# Patient Record
Sex: Female | Born: 1965
Health system: Southern US, Community
[De-identification: ages and names within clinical notes are randomized; demographics above are authoritative.]

---

## 1997-08-26 ENCOUNTER — Inpatient Hospital Stay (HOSPITAL_COMMUNITY): Admission: AD | Admit: 1997-08-26 | Discharge: 1997-08-29 | Payer: Self-pay | Admitting: Gynecology

## 1997-08-26 ENCOUNTER — Ambulatory Visit (HOSPITAL_COMMUNITY): Admission: RE | Admit: 1997-08-26 | Discharge: 1997-08-26 | Payer: Self-pay | Admitting: Gynecology

## 1997-08-26 ENCOUNTER — Other Ambulatory Visit: Admission: RE | Admit: 1997-08-26 | Discharge: 1997-08-26 | Payer: Self-pay | Admitting: Gynecology

## 1998-12-04 ENCOUNTER — Other Ambulatory Visit: Admission: RE | Admit: 1998-12-04 | Discharge: 1998-12-04 | Payer: Self-pay | Admitting: Gynecology

## 1999-11-12 ENCOUNTER — Emergency Department (HOSPITAL_COMMUNITY): Admission: EM | Admit: 1999-11-12 | Discharge: 1999-11-12 | Payer: Self-pay | Admitting: Emergency Medicine

## 1999-12-09 ENCOUNTER — Other Ambulatory Visit: Admission: RE | Admit: 1999-12-09 | Discharge: 1999-12-09 | Payer: Self-pay | Admitting: Gynecology

## 2002-06-25 ENCOUNTER — Other Ambulatory Visit: Admission: RE | Admit: 2002-06-25 | Discharge: 2002-06-25 | Payer: Self-pay | Admitting: Gynecology

## 2003-06-26 ENCOUNTER — Other Ambulatory Visit: Admission: RE | Admit: 2003-06-26 | Discharge: 2003-06-26 | Payer: Self-pay | Admitting: Gynecology

## 2004-09-08 ENCOUNTER — Other Ambulatory Visit: Admission: RE | Admit: 2004-09-08 | Discharge: 2004-09-08 | Payer: Self-pay | Admitting: Gynecology

## 2004-09-28 ENCOUNTER — Ambulatory Visit (HOSPITAL_COMMUNITY): Admission: RE | Admit: 2004-09-28 | Discharge: 2004-09-28 | Payer: Self-pay | Admitting: Gynecology

## 2005-09-28 ENCOUNTER — Other Ambulatory Visit: Admission: RE | Admit: 2005-09-28 | Discharge: 2005-09-28 | Payer: Self-pay | Admitting: Gynecology

## 2009-01-20 ENCOUNTER — Ambulatory Visit (HOSPITAL_COMMUNITY): Admission: RE | Admit: 2009-01-20 | Discharge: 2009-01-20 | Payer: Self-pay | Admitting: Obstetrics and Gynecology

## 2009-01-30 ENCOUNTER — Encounter: Admission: RE | Admit: 2009-01-30 | Discharge: 2009-01-30 | Payer: Self-pay | Admitting: Obstetrics and Gynecology

## 2009-02-04 ENCOUNTER — Encounter: Admission: RE | Admit: 2009-02-04 | Discharge: 2009-02-04 | Payer: Self-pay | Admitting: Obstetrics and Gynecology

## 2009-02-10 ENCOUNTER — Encounter: Admission: RE | Admit: 2009-02-10 | Discharge: 2009-02-10 | Payer: Self-pay | Admitting: Obstetrics and Gynecology

## 2009-03-31 ENCOUNTER — Ambulatory Visit (HOSPITAL_COMMUNITY): Admission: RE | Admit: 2009-03-31 | Discharge: 2009-03-31 | Payer: Self-pay | Admitting: Diagnostic Radiology

## 2009-04-01 ENCOUNTER — Ambulatory Visit (HOSPITAL_COMMUNITY): Admission: RE | Admit: 2009-04-01 | Discharge: 2009-04-01 | Payer: Self-pay | Admitting: Diagnostic Radiology

## 2009-04-03 ENCOUNTER — Ambulatory Visit (HOSPITAL_COMMUNITY): Admission: RE | Admit: 2009-04-03 | Discharge: 2009-04-04 | Payer: Self-pay | Admitting: Diagnostic Radiology

## 2009-05-06 ENCOUNTER — Encounter: Admission: RE | Admit: 2009-05-06 | Discharge: 2009-05-06 | Payer: Self-pay | Admitting: Diagnostic Radiology

## 2009-07-16 ENCOUNTER — Encounter: Admission: RE | Admit: 2009-07-16 | Discharge: 2009-07-16 | Payer: Self-pay | Admitting: Obstetrics and Gynecology

## 2009-09-29 ENCOUNTER — Encounter: Admission: RE | Admit: 2009-09-29 | Discharge: 2009-09-29 | Payer: Self-pay | Admitting: Obstetrics and Gynecology

## 2009-10-01 ENCOUNTER — Encounter: Admission: RE | Admit: 2009-10-01 | Discharge: 2009-10-01 | Payer: Self-pay | Admitting: Diagnostic Radiology

## 2010-02-23 ENCOUNTER — Encounter: Admission: RE | Admit: 2010-02-23 | Discharge: 2010-02-23 | Payer: Self-pay | Admitting: Obstetrics and Gynecology

## 2010-07-06 LAB — CBC
Hemoglobin: 13.9 g/dL (ref 12.0–15.0)
MCHC: 34 g/dL (ref 30.0–36.0)
RBC: 4.57 MIL/uL (ref 3.87–5.11)

## 2010-07-06 LAB — PREGNANCY, URINE: Preg Test, Ur: NEGATIVE

## 2010-07-06 LAB — CREATININE, SERUM: Creatinine, Ser: 0.89 mg/dL (ref 0.4–1.2)

## 2011-03-11 ENCOUNTER — Other Ambulatory Visit: Payer: Self-pay | Admitting: Obstetrics and Gynecology

## 2011-03-11 DIAGNOSIS — Z1231 Encounter for screening mammogram for malignant neoplasm of breast: Secondary | ICD-10-CM

## 2011-04-15 ENCOUNTER — Ambulatory Visit
Admission: RE | Admit: 2011-04-15 | Discharge: 2011-04-15 | Disposition: A | Discharge status: Home / Self Care | Payer: BC Managed Care – PPO | Source: Ambulatory Visit | Attending: Obstetrics and Gynecology | Admitting: Obstetrics and Gynecology

## 2011-04-15 DIAGNOSIS — Z1231 Encounter for screening mammogram for malignant neoplasm of breast: Secondary | ICD-10-CM

## 2012-04-19 ENCOUNTER — Other Ambulatory Visit: Payer: Self-pay | Admitting: Obstetrics and Gynecology

## 2012-04-19 DIAGNOSIS — Z1231 Encounter for screening mammogram for malignant neoplasm of breast: Secondary | ICD-10-CM

## 2012-05-29 ENCOUNTER — Ambulatory Visit
Admission: RE | Admit: 2012-05-29 | Discharge: 2012-05-29 | Disposition: A | Discharge status: Home / Self Care | Payer: BC Managed Care – PPO | Source: Ambulatory Visit | Attending: Obstetrics and Gynecology | Admitting: Obstetrics and Gynecology

## 2012-05-29 DIAGNOSIS — Z1231 Encounter for screening mammogram for malignant neoplasm of breast: Secondary | ICD-10-CM

## 2013-08-20 ENCOUNTER — Other Ambulatory Visit: Payer: Self-pay

## 2013-08-20 DIAGNOSIS — Z1231 Encounter for screening mammogram for malignant neoplasm of breast: Secondary | ICD-10-CM

## 2013-09-07 ENCOUNTER — Encounter (INDEPENDENT_AMBULATORY_CARE_PROVIDER_SITE_OTHER): Payer: Self-pay

## 2013-09-07 ENCOUNTER — Ambulatory Visit
Admission: RE | Admit: 2013-09-07 | Discharge: 2013-09-07 | Disposition: A | Discharge status: Home / Self Care | Payer: BC Managed Care – PPO | Source: Ambulatory Visit

## 2013-09-07 DIAGNOSIS — Z1231 Encounter for screening mammogram for malignant neoplasm of breast: Secondary | ICD-10-CM

## 2014-06-27 ENCOUNTER — Emergency Department (INDEPENDENT_AMBULATORY_CARE_PROVIDER_SITE_OTHER)
Admission: EM | Admit: 2014-06-27 | Discharge: 2014-06-27 | Disposition: A | Discharge status: Home / Self Care | Payer: BLUE CROSS/BLUE SHIELD | Source: Home / Self Care | Attending: Family Medicine | Admitting: Family Medicine

## 2014-06-27 ENCOUNTER — Encounter (HOSPITAL_COMMUNITY): Payer: Self-pay | Admitting: Emergency Medicine

## 2014-06-27 DIAGNOSIS — R05 Cough: Secondary | ICD-10-CM

## 2014-06-27 DIAGNOSIS — R0982 Postnasal drip: Secondary | ICD-10-CM

## 2014-06-27 DIAGNOSIS — J040 Acute laryngitis: Secondary | ICD-10-CM

## 2014-06-27 DIAGNOSIS — R059 Cough, unspecified: Secondary | ICD-10-CM

## 2014-06-27 MED ORDER — PREDNISONE 10 MG PO TABS: 30.0000 mg | ORAL_TABLET | Freq: Every day | ORAL | Status: DC

## 2014-06-27 MED ORDER — FLUTICASONE PROPIONATE 50 MCG/ACT NA SUSP: 2.0000 | Freq: Every day | NASAL | Status: DC

## 2014-06-27 MED ORDER — OMEPRAZOLE 40 MG PO CPDR
40.0000 mg | DELAYED_RELEASE_CAPSULE | Freq: Every day | ORAL | Status: DC
Start: 2014-06-27 — End: 2020-09-23

## 2014-06-27 NOTE — ED Provider Notes (Signed)
Andrea MenghiniSonya Valencia is a 49 y.o. female who presents to Urgent Care today for cough. Patient has a bothersome cough especially bedtime. This is been present for 3-4 days. Yesterday she developed a hoarse voice. She denies any wheezing or shortness of breath. She does note a runny nose and itchy watery eyes. She denies any vomiting or diarrhea. She has a history of acid reflux. She denies any burning in her chest currently. She's tried some Mucinex which did not help very much. No chest pains or palpitations. No significant fevers or chills.   History reviewed. No pertinent past medical history. History reviewed. No pertinent past surgical history. History  Substance Use Topics  . Smoking status: Never Smoker   . Smokeless tobacco: Not on file  . Alcohol Use: No   ROS as above Medications: No current facility-administered medications for this encounter.   Current Outpatient Prescriptions  Medication Sig Dispense Refill  . fluticasone (FLONASE) 50 MCG/ACT nasal spray Place 2 sprays into both nostrils daily. 16 g 2  . omeprazole (PRILOSEC) 40 MG capsule Take 1 capsule (40 mg total) by mouth daily. 30 capsule 1  . predniSONE (DELTASONE) 10 MG tablet Take 3 tablets (30 mg total) by mouth daily. 15 tablet 0   No Known Allergies   Exam:  BP 141/96 mmHg  Pulse 62  Temp(Src) 98.8 F (37.1 C) (Oral)  Resp 14  SpO2 99% Gen: Well NAD HEENT: EOMI,  MMM posterior pharynx with cobblestoning. Normal tympanic membranes bilaterally. Clear nasal discharge. Lungs: Normal work of breathing. CTABL hoarse voice Heart: RRR no MRG Abd: NABS, Soft. Nondistended, Nontender Exts: Brisk capillary refill, warm and well perfused.   No results found for this or any previous visit (from the past 24 hour(s)). No results found.  Assessment and Plan: 49 y.o. female with postnasal drip with laryngitis. Postnasal drip likely due to seasonal allergies. Treat with Flonase nasal spray and prednisone. Patient may also have  a component of acid reflux. Will treat with omeprazole as well. Work note provided. Return as needed.  Discussed warning signs or symptoms. Please see discharge instructions. Patient expresses understanding.     Rodolph BongEvan S Saleah Rishel, MD 06/27/14 1051

## 2014-06-27 NOTE — Discharge Instructions (Signed)
Thank you for coming in today. Call or go to the emergency room if you get worse, have trouble breathing, have chest pains, or palpitations.    Cough, Adult  A cough is a reflex that helps clear your throat and airways. It can help heal the body or may be a reaction to an irritated airway. A cough may only last 2 or 3 weeks (acute) or may last more than 8 weeks (chronic).  CAUSES Acute cough:  Viral or bacterial infections. Chronic cough:  Infections.  Allergies.  Asthma.  Post-nasal drip.  Smoking.  Heartburn or acid reflux.  Some medicines.  Chronic lung problems (COPD).  Cancer. SYMPTOMS   Cough.  Fever.  Chest pain.  Increased breathing rate.  High-pitched whistling sound when breathing (wheezing).  Colored mucus that you cough up (sputum). TREATMENT   A bacterial cough may be treated with antibiotic medicine.  A viral cough must run its course and will not respond to antibiotics.  Your caregiver may recommend other treatments if you have a chronic cough. HOME CARE INSTRUCTIONS   Only take over-the-counter or prescription medicines for pain, discomfort, or fever as directed by your caregiver. Use cough suppressants only as directed by your caregiver.  Use a cold steam vaporizer or humidifier in your bedroom or home to help loosen secretions.  Sleep in a semi-upright position if your cough is worse at night.  Rest as needed.  Stop smoking if you smoke. SEEK IMMEDIATE MEDICAL CARE IF:   You have pus in your sputum.  Your cough starts to worsen.  You cannot control your cough with suppressants and are losing sleep.  You begin coughing up blood.  You have difficulty breathing.  You develop pain which is getting worse or is uncontrolled with medicine.  You have a fever. MAKE SURE YOU:   Understand these instructions.  Will watch your condition.  Will get help right away if you are not doing well or get worse. Document Released:  09/18/2010 Document Revised: 06/14/2011 Document Reviewed: 09/18/2010 [facility] Patient Information 2015 [place], [place]. This information is not intended to replace advice given to you by your health care provider. Make sure you discuss any questions you have with your health care provider.   Laryngitis At the top of your windpipe is your voice box. It is the source of your voice. Inside your voice box are 2 bands of muscles called vocal cords. When you breathe, your vocal cords are relaxed and open so that air can get into the lungs. When you decide to say something, these cords come together and vibrate. The sound from these vibrations goes into your throat and comes out through your mouth as sound. Laryngitis is an inflammation of the vocal cords that causes hoarseness, cough, loss of voice, sore throat, and dry throat. Laryngitis can be temporary (acute) or long-term (chronic). Most cases of acute laryngitis improve with time.Chronic laryngitis lasts for more than 3 weeks. CAUSES Laryngitis can often be related to excessive smoking, talking, or yelling, as well as inhalation of toxic fumes and allergies. Acute laryngitis is usually caused by a viral infection, vocal strain, measles or mumps, or bacterial infections. Chronic laryngitis is usually caused by vocal cord strain, vocal cord injury, postnasal drip, growths on the vocal cords, or acid reflux. SYMPTOMS   Cough.  Sore throat.  Dry throat. RISK FACTORS  Respiratory infections.  Exposure to irritating substances, such as cigarette smoke, excessive amounts of alcohol, stomach acids, and workplace chemicals.  Voice  trauma, such as vocal cord injury from shouting or speaking too loud. DIAGNOSIS  Your cargiver will perform a physical exam. During the physical exam, your caregiver will examine your throat. The most common sign of laryngitis is hoarseness. Laryngoscopy may be necessary to confirm the diagnosis of this condition. This  procedure allows your caregiver to look into the larynx. HOME CARE INSTRUCTIONS  Drink enough fluids to keep your urine clear or pale yellow.  Rest until you no longer have symptoms or as directed by your caregiver.  Breathe in moist air.  Take all medicine as directed by your caregiver.  Do not smoke.  Talk as little as possible (this includes whispering).  Write on paper instead of talking until your voice is back to normal.  Follow up with your caregiver if your condition has not improved after 10 days. SEEK MEDICAL CARE IF:   You have trouble breathing.  You cough up blood.  You have persistent fever.  You have increasing pain.  You have difficulty swallowing. MAKE SURE YOU:  Understand these instructions.  Will watch your condition.  Will get help right away if you are not doing well or get worse. Document Released: 03/22/2005 Document Revised: 06/14/2011 Document Reviewed: 05/28/2010 [facility] Patient Information 2015 [place], [place]. This information is not intended to replace advice given to you by your health care provider. Make sure you discuss any questions you have with your health care provider.

## 2014-06-27 NOTE — ED Notes (Signed)
Reports having cold symptoms on 3/18.  Reports cough stating on the 21 st only at night and is productive.  Work on 3/23 with no voice.  Denies fever and diarrhea.  C/o vomiting with coughing spells.  No relief with otc meds.

## 2014-09-19 ENCOUNTER — Other Ambulatory Visit: Payer: Self-pay

## 2014-09-19 DIAGNOSIS — Z1231 Encounter for screening mammogram for malignant neoplasm of breast: Secondary | ICD-10-CM

## 2014-09-23 ENCOUNTER — Ambulatory Visit
Admission: RE | Admit: 2014-09-23 | Discharge: 2014-09-23 | Disposition: A | Discharge status: Home / Self Care | Payer: BLUE CROSS/BLUE SHIELD | Source: Ambulatory Visit

## 2014-09-23 DIAGNOSIS — Z1231 Encounter for screening mammogram for malignant neoplasm of breast: Secondary | ICD-10-CM

## 2014-09-25 ENCOUNTER — Other Ambulatory Visit: Payer: Self-pay | Admitting: Obstetrics and Gynecology

## 2014-09-25 DIAGNOSIS — R928 Other abnormal and inconclusive findings on diagnostic imaging of breast: Secondary | ICD-10-CM

## 2014-09-30 ENCOUNTER — Ambulatory Visit
Admission: RE | Admit: 2014-09-30 | Discharge: 2014-09-30 | Disposition: A | Discharge status: Home / Self Care | Payer: BLUE CROSS/BLUE SHIELD | Source: Ambulatory Visit | Attending: Obstetrics and Gynecology | Admitting: Obstetrics and Gynecology

## 2014-09-30 DIAGNOSIS — R928 Other abnormal and inconclusive findings on diagnostic imaging of breast: Secondary | ICD-10-CM

## 2015-12-29 ENCOUNTER — Other Ambulatory Visit: Payer: Self-pay | Admitting: Obstetrics and Gynecology

## 2015-12-29 DIAGNOSIS — Z1231 Encounter for screening mammogram for malignant neoplasm of breast: Secondary | ICD-10-CM

## 2016-01-07 ENCOUNTER — Ambulatory Visit
Admission: RE | Admit: 2016-01-07 | Discharge: 2016-01-07 | Disposition: A | Discharge status: Home / Self Care | Payer: BLUE CROSS/BLUE SHIELD | Source: Ambulatory Visit | Attending: Obstetrics and Gynecology | Admitting: Obstetrics and Gynecology

## 2016-01-07 DIAGNOSIS — Z1231 Encounter for screening mammogram for malignant neoplasm of breast: Secondary | ICD-10-CM

## 2017-02-22 DIAGNOSIS — Z713 Dietary counseling and surveillance: Secondary | ICD-10-CM | POA: Diagnosis not present

## 2017-02-28 ENCOUNTER — Other Ambulatory Visit: Payer: Self-pay | Admitting: Obstetrics and Gynecology

## 2017-02-28 DIAGNOSIS — Z1231 Encounter for screening mammogram for malignant neoplasm of breast: Secondary | ICD-10-CM

## 2017-03-25 ENCOUNTER — Ambulatory Visit: Payer: BLUE CROSS/BLUE SHIELD

## 2017-04-20 ENCOUNTER — Ambulatory Visit
Admission: RE | Admit: 2017-04-20 | Discharge: 2017-04-20 | Disposition: A | Discharge status: Home / Self Care | Payer: BLUE CROSS/BLUE SHIELD | Source: Ambulatory Visit | Attending: Obstetrics and Gynecology | Admitting: Obstetrics and Gynecology

## 2017-04-20 DIAGNOSIS — Z1231 Encounter for screening mammogram for malignant neoplasm of breast: Secondary | ICD-10-CM | POA: Diagnosis not present

## 2017-04-20 DIAGNOSIS — Z0000 Encounter for general adult medical examination without abnormal findings: Secondary | ICD-10-CM | POA: Diagnosis not present

## 2017-04-21 DIAGNOSIS — Z1151 Encounter for screening for human papillomavirus (HPV): Secondary | ICD-10-CM | POA: Diagnosis not present

## 2017-04-21 DIAGNOSIS — Z6829 Body mass index (BMI) 29.0-29.9, adult: Secondary | ICD-10-CM | POA: Diagnosis not present

## 2017-04-21 DIAGNOSIS — Z01419 Encounter for gynecological examination (general) (routine) without abnormal findings: Secondary | ICD-10-CM | POA: Diagnosis not present

## 2017-04-26 DIAGNOSIS — Z713 Dietary counseling and surveillance: Secondary | ICD-10-CM | POA: Diagnosis not present

## 2017-06-23 DIAGNOSIS — Z1211 Encounter for screening for malignant neoplasm of colon: Secondary | ICD-10-CM | POA: Diagnosis not present

## 2017-06-23 DIAGNOSIS — Z800 Family history of malignant neoplasm of digestive organs: Secondary | ICD-10-CM | POA: Diagnosis not present

## 2017-06-28 DIAGNOSIS — Z713 Dietary counseling and surveillance: Secondary | ICD-10-CM | POA: Diagnosis not present

## 2017-07-15 DIAGNOSIS — Z800 Family history of malignant neoplasm of digestive organs: Secondary | ICD-10-CM | POA: Diagnosis not present

## 2017-07-15 DIAGNOSIS — Z1211 Encounter for screening for malignant neoplasm of colon: Secondary | ICD-10-CM | POA: Diagnosis not present

## 2017-09-27 DIAGNOSIS — Z713 Dietary counseling and surveillance: Secondary | ICD-10-CM | POA: Diagnosis not present

## 2018-03-07 DIAGNOSIS — Z713 Dietary counseling and surveillance: Secondary | ICD-10-CM | POA: Diagnosis not present

## 2018-05-03 ENCOUNTER — Other Ambulatory Visit: Payer: Self-pay | Admitting: Obstetrics and Gynecology

## 2018-05-03 DIAGNOSIS — Z1231 Encounter for screening mammogram for malignant neoplasm of breast: Secondary | ICD-10-CM

## 2018-05-09 DIAGNOSIS — Z713 Dietary counseling and surveillance: Secondary | ICD-10-CM | POA: Diagnosis not present

## 2018-05-24 DIAGNOSIS — Z0000 Encounter for general adult medical examination without abnormal findings: Secondary | ICD-10-CM | POA: Diagnosis not present

## 2018-05-31 ENCOUNTER — Ambulatory Visit
Admission: RE | Admit: 2018-05-31 | Discharge: 2018-05-31 | Disposition: A | Discharge status: Home / Self Care | Payer: BLUE CROSS/BLUE SHIELD | Source: Ambulatory Visit | Attending: Obstetrics and Gynecology | Admitting: Obstetrics and Gynecology

## 2018-05-31 DIAGNOSIS — Z1231 Encounter for screening mammogram for malignant neoplasm of breast: Secondary | ICD-10-CM

## 2018-06-01 ENCOUNTER — Other Ambulatory Visit: Payer: Self-pay | Admitting: Obstetrics and Gynecology

## 2018-06-01 DIAGNOSIS — R928 Other abnormal and inconclusive findings on diagnostic imaging of breast: Secondary | ICD-10-CM

## 2018-06-07 ENCOUNTER — Ambulatory Visit
Admission: RE | Admit: 2018-06-07 | Discharge: 2018-06-07 | Disposition: A | Discharge status: Home / Self Care | Payer: BLUE CROSS/BLUE SHIELD | Source: Ambulatory Visit | Attending: Obstetrics and Gynecology | Admitting: Obstetrics and Gynecology

## 2018-06-07 DIAGNOSIS — R928 Other abnormal and inconclusive findings on diagnostic imaging of breast: Secondary | ICD-10-CM

## 2018-06-07 DIAGNOSIS — N6012 Diffuse cystic mastopathy of left breast: Secondary | ICD-10-CM | POA: Diagnosis not present

## 2018-06-07 DIAGNOSIS — R922 Inconclusive mammogram: Secondary | ICD-10-CM | POA: Diagnosis not present

## 2018-12-19 DIAGNOSIS — Z6829 Body mass index (BMI) 29.0-29.9, adult: Secondary | ICD-10-CM | POA: Diagnosis not present

## 2018-12-19 DIAGNOSIS — Z23 Encounter for immunization: Secondary | ICD-10-CM | POA: Diagnosis not present

## 2018-12-19 DIAGNOSIS — Z1151 Encounter for screening for human papillomavirus (HPV): Secondary | ICD-10-CM | POA: Diagnosis not present

## 2018-12-19 DIAGNOSIS — Z01419 Encounter for gynecological examination (general) (routine) without abnormal findings: Secondary | ICD-10-CM | POA: Diagnosis not present

## 2019-04-19 DIAGNOSIS — Z713 Dietary counseling and surveillance: Secondary | ICD-10-CM | POA: Diagnosis not present

## 2019-07-03 DIAGNOSIS — Z713 Dietary counseling and surveillance: Secondary | ICD-10-CM | POA: Diagnosis not present

## 2019-07-24 ENCOUNTER — Other Ambulatory Visit: Payer: Self-pay | Admitting: Obstetrics and Gynecology

## 2019-07-24 DIAGNOSIS — Z1231 Encounter for screening mammogram for malignant neoplasm of breast: Secondary | ICD-10-CM

## 2019-08-27 ENCOUNTER — Ambulatory Visit
Admission: RE | Admit: 2019-08-27 | Discharge: 2019-08-27 | Disposition: A | Discharge status: Home / Self Care | Payer: BC Managed Care – PPO | Source: Ambulatory Visit | Attending: Obstetrics and Gynecology | Admitting: Obstetrics and Gynecology

## 2019-08-27 ENCOUNTER — Other Ambulatory Visit: Payer: Self-pay

## 2019-08-27 DIAGNOSIS — Z1231 Encounter for screening mammogram for malignant neoplasm of breast: Secondary | ICD-10-CM | POA: Diagnosis not present

## 2019-09-20 DIAGNOSIS — Z713 Dietary counseling and surveillance: Secondary | ICD-10-CM | POA: Diagnosis not present

## 2019-11-21 DIAGNOSIS — Z713 Dietary counseling and surveillance: Secondary | ICD-10-CM | POA: Diagnosis not present

## 2020-01-28 DIAGNOSIS — Z713 Dietary counseling and surveillance: Secondary | ICD-10-CM | POA: Diagnosis not present

## 2020-03-17 DIAGNOSIS — Z780 Asymptomatic menopausal state: Secondary | ICD-10-CM | POA: Diagnosis not present

## 2020-03-17 DIAGNOSIS — Z01419 Encounter for gynecological examination (general) (routine) without abnormal findings: Secondary | ICD-10-CM | POA: Diagnosis not present

## 2020-04-19 ENCOUNTER — Other Ambulatory Visit: Payer: Self-pay | Admitting: Obstetrics and Gynecology

## 2020-04-19 DIAGNOSIS — Z780 Asymptomatic menopausal state: Secondary | ICD-10-CM

## 2020-08-16 ENCOUNTER — Encounter (HOSPITAL_COMMUNITY): Payer: Self-pay

## 2020-08-16 ENCOUNTER — Ambulatory Visit (HOSPITAL_COMMUNITY)
Admission: EM | Admit: 2020-08-16 | Discharge: 2020-08-16 | Disposition: A | Discharge status: Home / Self Care | Payer: BC Managed Care – PPO | Attending: Medical Oncology | Admitting: Medical Oncology

## 2020-08-16 ENCOUNTER — Other Ambulatory Visit: Payer: Self-pay

## 2020-08-16 DIAGNOSIS — J069 Acute upper respiratory infection, unspecified: Secondary | ICD-10-CM | POA: Diagnosis not present

## 2020-08-16 DIAGNOSIS — U071 COVID-19: Secondary | ICD-10-CM | POA: Insufficient documentation

## 2020-08-16 MED ORDER — ALBUTEROL SULFATE HFA 108 (90 BASE) MCG/ACT IN AERS: 1.0000 | INHALATION_SPRAY | Freq: Four times a day (QID) | RESPIRATORY_TRACT | 0 refills | Status: DC | PRN

## 2020-08-16 MED ORDER — FLUTICASONE PROPIONATE 50 MCG/ACT NA SUSP: 2.0000 | Freq: Every day | NASAL | 0 refills | Status: DC

## 2020-08-16 MED ORDER — BENZONATATE 100 MG PO CAPS: 100.0000 mg | ORAL_CAPSULE | Freq: Three times a day (TID) | ORAL | 0 refills | Status: DC

## 2020-08-16 NOTE — ED Triage Notes (Signed)
Pt in with c/o productive cough x 4 days  Pt has been taking robitussin and mucinex with no relief

## 2020-08-16 NOTE — ED Provider Notes (Signed)
[facility]    CSN: [id number] Arrival date & time: 08/16/20  1400      History   Chief Complaint Chief Complaint  Patient presents with  . Cough  . chest congestion    HPI Michaelyn Wall is a 55 y.o. female.   HPI   Cough: Pt reports that for the past 4 days she has had a dry to occasionally productive cough. Associated low grade fevers, sore throat and clear nasal drainage. Sputum is clear. She has been taking Robitussin and mucinex with no relief. No chest pains, SOB, wheezing.   History reviewed. No pertinent past medical history.  There are no problems to display for this patient.   History reviewed. No pertinent surgical history.  OB History   No obstetric history on file.      Home Medications    Prior to Admission medications   Medication Sig Start Date End Date Taking? Authorizing Provider  fluticasone (FLONASE) 50 MCG/ACT nasal spray Place 2 sprays into both nostrils daily. 06/27/14   Rodolph Bong, MD  omeprazole (PRILOSEC) 40 MG capsule Take 1 capsule (40 mg total) by mouth daily. 06/27/14   Rodolph Bong, MD  predniSONE (DELTASONE) 10 MG tablet Take 3 tablets (30 mg total) by mouth daily. 06/27/14   Rodolph Bong, MD    Family History History reviewed. No pertinent family history.  Social History Social History   Tobacco Use  . Smoking status: Never Smoker  . Smokeless tobacco: Never Used  Substance Use Topics  . Alcohol use: No  . Drug use: No     Allergies   Patient has no known allergies.   Review of Systems Review of Systems  As stated above in HPI Physical Exam Triage Vital Signs ED Triage Vitals  Enc Vitals Group     BP 08/16/20 1508 (!) 151/91     Pulse Rate 08/16/20 1508 74     Resp 08/16/20 1508 18     Temp 08/16/20 1508 99.7 F (37.6 C)     Temp src --      SpO2 08/16/20 1508 94 %     Weight --      Height --      Head Circumference --      Peak Flow --      Pain Score 08/16/20 1506 0     Pain Loc  --      Pain Edu? --      Excl. in [place]? --    No data found.  Updated Vital Signs BP (!) 151/91   Pulse 74   Temp 99.7 F (37.6 C)   Resp 18   SpO2 94%    Physical Exam Vitals and nursing note reviewed.  Constitutional:      General: She is not in acute distress.    Appearance: Normal appearance. She is not ill-appearing, toxic-appearing or diaphoretic.  HENT:     Head: Normocephalic and atraumatic.     Right Ear: Tympanic membrane, ear canal and external ear normal.     Left Ear: Tympanic membrane, ear canal and external ear normal.     Nose: Congestion and rhinorrhea (moderate clear) present.     Mouth/Throat:     Mouth: Mucous membranes are moist.     Pharynx: Posterior oropharyngeal erythema (mild) present. No oropharyngeal exudate.  Eyes:     Extraocular Movements: Extraocular movements intact.     Pupils: Pupils are equal, round, and reactive to light.  Cardiovascular:  Rate and Rhythm: Normal rate and regular rhythm.     Heart sounds: Normal heart sounds.  Pulmonary:     Effort: Pulmonary effort is normal.     Breath sounds: Normal breath sounds.     Comments: Occasional slight wheeze like cough Musculoskeletal:     Cervical back: Normal range of motion and neck supple.  Lymphadenopathy:     Cervical: No cervical adenopathy.  Skin:    General: Skin is warm.  Neurological:     Mental Status: She is alert and oriented to person, place, and time.      UC Treatments / Results  Labs (all labs ordered are listed, but only abnormal results are displayed) Labs Reviewed - No data to display  EKG   Radiology No results found.  Procedures Procedures (including critical care time)  Medications Ordered in UC Medications - No data to display  Initial Impression / Assessment and Plan / UC Course  I have reviewed the triage vital signs and the nursing notes.  Pertinent labs & imaging results that were available during my care of the patient were reviewed  by me and considered in my medical decision making (see chart for details).     New.  To me this appears to be influenza that we have seen in the community.  We discussed that she is past the testing or treatment.  For now we will treat symptomatically and swab for COVID to ensure that this is not not the cause of her symptoms.  Discussed red flag signs and symptoms along the with the importance of rest, hydration, healthy diet.  Final Clinical Impressions(s) / UC Diagnoses   Final diagnoses:  None   Discharge Instructions   None    ED Prescriptions    None     PDMP not reviewed this encounter.   Rushie Chestnut, [place] 08/16/20 1555

## 2020-08-17 LAB — SARS CORONAVIRUS 2 (TAT 6-24 HRS): SARS Coronavirus 2: POSITIVE — AB

## 2020-09-11 ENCOUNTER — Other Ambulatory Visit: Payer: Self-pay | Admitting: Obstetrics and Gynecology

## 2020-09-11 DIAGNOSIS — Z1231 Encounter for screening mammogram for malignant neoplasm of breast: Secondary | ICD-10-CM

## 2020-09-16 ENCOUNTER — Other Ambulatory Visit: Payer: Self-pay

## 2020-09-16 ENCOUNTER — Ambulatory Visit
Admission: RE | Admit: 2020-09-16 | Discharge: 2020-09-16 | Disposition: A | Discharge status: Home / Self Care | Payer: BC Managed Care – PPO | Source: Ambulatory Visit | Attending: Obstetrics and Gynecology | Admitting: Obstetrics and Gynecology

## 2020-09-16 DIAGNOSIS — M85851 Other specified disorders of bone density and structure, right thigh: Secondary | ICD-10-CM | POA: Diagnosis not present

## 2020-09-16 DIAGNOSIS — Z780 Asymptomatic menopausal state: Secondary | ICD-10-CM

## 2020-09-23 ENCOUNTER — Encounter: Payer: Self-pay | Admitting: Family Medicine

## 2020-09-23 ENCOUNTER — Ambulatory Visit: Payer: BC Managed Care – PPO | Admitting: Family Medicine

## 2020-09-23 VITALS — BP 124/80 | HR 55 | Temp 98.0°F | Ht 62.25 in | Wt 161.4 lb

## 2020-09-23 DIAGNOSIS — R351 Nocturia: Secondary | ICD-10-CM | POA: Diagnosis not present

## 2020-09-23 DIAGNOSIS — Z7689 Persons encountering health services in other specified circumstances: Secondary | ICD-10-CM

## 2020-09-23 DIAGNOSIS — R319 Hematuria, unspecified: Secondary | ICD-10-CM

## 2020-09-23 LAB — POCT URINALYSIS DIP (PROADVANTAGE DEVICE)
Bilirubin, UA: NEGATIVE
Glucose, UA: NEGATIVE mg/dL
Ketones, POC UA: NEGATIVE mg/dL
Leukocytes, UA: NEGATIVE
Nitrite, UA: NEGATIVE
Protein Ur, POC: NEGATIVE mg/dL
Specific Gravity, Urine: 1.02
Urobilinogen, Ur: 0.2
pH, UA: 6 (ref 5.0–8.0)

## 2020-09-23 NOTE — Progress Notes (Signed)
   Subjective:    Patient ID: Andrea Valencia, female    DOB: [birth year], 55 y.o.   MRN: [id number]  HPI Chief Complaint  Patient presents with   other    New pt. Est. No other issues, wants to discuss shingles vaccine [facility] Dr. Purnell Shoemaker last mammogram 5/21 schedule 8/422    She is new to the practice and here to establish care.  Previous medical care: no PCP in years    Other providers: OB/GYN - Dr. Cherly Hensen   Frequent urination at night for the past 3 months or longer. Urinating 2 times per night.  She typically drinks fluids until 8 PM and then goes to bed at 10 PM. Denies blurred vision, unexplained weight loss, polydipsia. Diabetes in mother.   Works for [employer] and gets annual labs.   States shad Covid in mid May and has fully recovered.    Reviewed allergies, medications, past medical, surgical, family, and social history.    Review of Systems Pertinent positives and negatives in the history of present illness.     Objective:   Physical Exam BP 124/80   Pulse (!) 55   Temp 98 F (36.7 C)   Ht 5' 2.25" (1.581 m)   Wt 161 lb 6.4 oz (73.2 kg)   BMI 29.28 kg/m   Alert and in no distress. Cardiac exam shows a regular sinus rhythm without murmurs or gallops. Lungs are clear to auscultation.  Extremities without edema.  Skin is warm and dry.  Normal speech, mood and memory        Assessment & Plan:  Frequent urination at night - Plan: POCT Urinalysis DIP (Proadvantage Device)  Encounter to establish care  Hematuria, unspecified type - Plan: Urine Microscopic  She is a pleasant 55 year old female who is new to the practice here to establish care. Nighttime frequent urination without any other symptoms.  Urinalysis dipstick shows 1+ blood and negative otherwise. Microscopic urinalysis ordered. Advised her to stop drinking fluids earlier in the evening to see if this helps.  Make sure she is voiding prior to going to bed. She declines labs and  states she has upcoming labs through her work and will forward these to me.

## 2020-09-24 ENCOUNTER — Ambulatory Visit: Payer: Self-pay | Admitting: Family Medicine

## 2020-09-24 LAB — URINALYSIS, MICROSCOPIC ONLY
Bacteria, UA: NONE SEEN
Casts: NONE SEEN /lpf
Epithelial Cells (non renal): NONE SEEN /hpf (ref 0–10)
RBC: NONE SEEN /hpf (ref 0–2)
WBC, UA: NONE SEEN /hpf (ref 0–5)

## 2020-11-06 ENCOUNTER — Ambulatory Visit: Payer: BC Managed Care – PPO

## 2020-11-06 ENCOUNTER — Ambulatory Visit
Admission: RE | Admit: 2020-11-06 | Discharge: 2020-11-06 | Disposition: A | Discharge status: Home / Self Care | Payer: BC Managed Care – PPO | Source: Ambulatory Visit | Attending: Obstetrics and Gynecology | Admitting: Obstetrics and Gynecology

## 2020-11-06 ENCOUNTER — Other Ambulatory Visit: Payer: Self-pay

## 2020-11-06 DIAGNOSIS — Z1231 Encounter for screening mammogram for malignant neoplasm of breast: Secondary | ICD-10-CM | POA: Diagnosis not present

## 2021-03-24 DIAGNOSIS — Z780 Asymptomatic menopausal state: Secondary | ICD-10-CM | POA: Diagnosis not present

## 2021-03-24 DIAGNOSIS — Z01419 Encounter for gynecological examination (general) (routine) without abnormal findings: Secondary | ICD-10-CM | POA: Diagnosis not present

## 2021-08-20 ENCOUNTER — Encounter (HOSPITAL_COMMUNITY): Payer: Self-pay

## 2021-08-20 ENCOUNTER — Other Ambulatory Visit: Payer: Self-pay

## 2021-08-20 ENCOUNTER — Ambulatory Visit (HOSPITAL_COMMUNITY)
Admission: RE | Admit: 2021-08-20 | Discharge: 2021-08-20 | Disposition: A | Discharge status: Home / Self Care | Payer: BC Managed Care – PPO | Source: Ambulatory Visit | Attending: Family Medicine | Admitting: Family Medicine

## 2021-08-20 VITALS — BP 142/93 | HR 66 | Temp 98.3°F | Resp 20

## 2021-08-20 DIAGNOSIS — J069 Acute upper respiratory infection, unspecified: Secondary | ICD-10-CM

## 2021-08-20 DIAGNOSIS — J029 Acute pharyngitis, unspecified: Secondary | ICD-10-CM | POA: Diagnosis not present

## 2021-08-20 LAB — POCT RAPID STREP A, ED / UC: Streptococcus, Group A Screen (Direct): NEGATIVE

## 2021-08-20 NOTE — ED Triage Notes (Signed)
Complains of sore throat that started Monday.  Reports drainage in throat.  Patient reports taking sudafed and mucinex over the past 3 days has not helped.

## 2021-08-20 NOTE — Discharge Instructions (Signed)
Your strep test was negative today.  This will be sent for culture and you will be notified if positive to start an antibiotic.  For now, your symptoms appears to be viral vs allergy in nature.  I see no signs of bacterial infection.  I recommend you continue to use over the counter sudafed for congestion.  You may also trial over the counter zyrtec and flonase to help with sinus drainage and congestion.  If you have symptoms for greater than 10 days, or develop sinus pain, pressure this may indicate a sinus infection and you may return for further evaluation.

## 2021-08-20 NOTE — ED Provider Notes (Signed)
[place]    CSN: [id number] Arrival date & time: 08/20/21  [id number]      History   Chief Complaint Chief Complaint  Patient presents with   Sore Throat    Cold/nasal since 5/15, taking OTC mucinex and sudafed.  At-home Covid test negative 5/15 & 5/16 - Entered by patient   Appointment    9:30    HPI Andrea Valencia is a 56 y.o. female.   Patient is here for sore throat x 4 days.  Sinus congestion and drainage as well.  No cough.  No fevers/chills.  No headache.  No n/v. No known sick contacts.  Taking otc meds without much help.   History reviewed. No pertinent past medical history.  There are no problems to display for this patient.   History reviewed. No pertinent surgical history.  OB History   No obstetric history on file.      Home Medications    Prior to Admission medications   Medication Sig Start Date End Date Taking? Authorizing Provider  calcium-vitamin D (OSCAL WITH D) 250-125 MG-UNIT tablet Take 1 tablet by mouth daily.    [provider]    Family History History reviewed. No pertinent family history.  Social History Social History   Tobacco Use   Smoking status: Never   Smokeless tobacco: Never  Vaping Use   Vaping Use: Never used  Substance Use Topics   Alcohol use: No   Drug use: No     Allergies   Patient has no known allergies.   Review of Systems Review of Systems  Constitutional: Negative.   HENT:  Positive for congestion, rhinorrhea and sore throat. Negative for sinus pressure and sinus pain.   Respiratory: Negative.    Cardiovascular: Negative.   Gastrointestinal: Negative.   Genitourinary: Negative.   Musculoskeletal: Negative.     Physical Exam Triage Vital Signs ED Triage Vitals  Enc Vitals Group     BP 08/20/21 0954 (!) 142/93     Pulse Rate 08/20/21 0954 66     Resp 08/20/21 0954 20     Temp 08/20/21 0954 98.3 F (36.8 C)     Temp Source 08/20/21 0954 Oral     SpO2 08/20/21 0954  95 %     Weight --      Height --      Head Circumference --      Peak Flow --      Pain Score 08/20/21 0951 3     Pain Loc --      Pain Edu? --      Excl. in [place]? --    No data found.  Updated Vital Signs BP (!) 142/93 (BP Location: Right Arm)   Pulse 66   Temp 98.3 F (36.8 C) (Oral)   Resp 20   SpO2 95%   Visual Acuity Right Eye Distance:   Left Eye Distance:   Bilateral Distance:    Right Eye Near:   Left Eye Near:    Bilateral Near:     Physical Exam Constitutional:      Appearance: She is well-developed.  HENT:     Head: Normocephalic and atraumatic.     Nose: Congestion and rhinorrhea present.     Mouth/Throat:     Mouth: Mucous membranes are moist.     Pharynx: Posterior oropharyngeal erythema present. No pharyngeal swelling or oropharyngeal exudate.     Tonsils: Tonsillar exudate present. No tonsillar abscesses.  Cardiovascular:  Rate and Rhythm: Normal rate and regular rhythm.     Heart sounds: Normal heart sounds.  Pulmonary:     Effort: Pulmonary effort is normal.  Musculoskeletal:     Cervical back: Normal range of motion and neck supple.  Lymphadenopathy:     Cervical: No cervical adenopathy.  Skin:    General: Skin is warm.  Neurological:     General: No focal deficit present.     Mental Status: She is alert.  Psychiatric:        Mood and Affect: Mood normal.     UC Treatments / Results  Labs (all labs ordered are listed, but only abnormal results are displayed) Labs Reviewed  CULTURE, GROUP A STREP [facility])  POCT RAPID STREP A, ED / UC    EKG   Radiology No results found.  Procedures Procedures (including critical care time)  Medications Ordered in UC Medications - No data to display  Initial Impression / Assessment and Plan / UC Course  I have reviewed the triage vital signs and the nursing notes.  Pertinent labs & imaging results that were available during my care of the patient were reviewed by me and considered in my  medical decision making (see chart for details).    Final Clinical Impressions(s) / UC Diagnoses   Final diagnoses:  Sore throat  Upper respiratory tract infection, unspecified type     Discharge Instructions      Your strep test was negative today.  This will be sent for culture and you will be notified if positive to start an antibiotic.  For now, your symptoms appears to be viral vs allergy in nature.  I see no signs of bacterial infection.  I recommend you continue to use over the counter sudafed for congestion.  You may also trial over the counter zyrtec and flonase to help with sinus drainage and congestion.  If you have symptoms for greater than 10 days, or develop sinus pain, pressure this may indicate a sinus infection and you may return for further evaluation.     ED Prescriptions   None    PDMP not reviewed this encounter.   Rondel Oh, MD 08/20/21 1028

## 2021-08-23 LAB — CULTURE, GROUP A STREP (THRC)

## 2021-10-16 ENCOUNTER — Other Ambulatory Visit: Payer: Self-pay | Admitting: Obstetrics and Gynecology

## 2021-10-16 DIAGNOSIS — Z1231 Encounter for screening mammogram for malignant neoplasm of breast: Secondary | ICD-10-CM

## 2021-11-10 ENCOUNTER — Ambulatory Visit: Payer: BC Managed Care – PPO

## 2021-11-18 ENCOUNTER — Ambulatory Visit
Admission: RE | Admit: 2021-11-18 | Discharge: 2021-11-18 | Disposition: A | Discharge status: Home / Self Care | Payer: BC Managed Care – PPO | Source: Ambulatory Visit | Attending: Obstetrics and Gynecology | Admitting: Obstetrics and Gynecology

## 2021-11-18 DIAGNOSIS — Z1231 Encounter for screening mammogram for malignant neoplasm of breast: Secondary | ICD-10-CM | POA: Diagnosis not present

## 2021-12-02 NOTE — Progress Notes (Unsigned)
No chief complaint on file.    Patient seen once in our office, to establish care with Vickie in 09/2020.  BP was normal at that time. Higher during illness at [facility] since then, in May 2023. BP Readings from Last 3 Encounters:  08/20/21 (!) 142/93  09/23/20 124/80  08/16/20 (!) 151/91   Gets yearly labs through [employer] got results sent here

## 2021-12-03 ENCOUNTER — Ambulatory Visit: Payer: BC Managed Care – PPO | Admitting: Family Medicine

## 2021-12-03 ENCOUNTER — Encounter: Payer: Self-pay | Admitting: Family Medicine

## 2021-12-03 VITALS — BP 140/88 | HR 68 | Ht 62.0 in | Wt 167.6 lb

## 2021-12-03 DIAGNOSIS — Z6830 Body mass index (BMI) 30.0-30.9, adult: Secondary | ICD-10-CM

## 2021-12-03 DIAGNOSIS — R030 Elevated blood-pressure reading, without diagnosis of hypertension: Secondary | ICD-10-CM | POA: Diagnosis not present

## 2021-12-03 NOTE — Patient Instructions (Addendum)
We discussed cutting back on the salt (sodium) in your diet. Use more seasonings other than salt. Do not salt without tasting your food. Stop eating chips (snack on baby carrots or fruit instead). Cut back on salt in your diet in general--see handout.  Try and eliminate regular sodas--which have a lot of sugar, calories, and some sodium.  Switch to a diet soda to eliminate the calories and help with weight loss.  Continue to get a minimum of 150 minutes of aerobic exercise each week.  Check your blood pressure regularly, and record on the sheet provided. Bring this sheet and your blood pressure monitor to follow-up visits so that we can assess the accuracy of your monitor and see if your blood pressure is lower at home than in the office.  Please send [place] a copy of your [facility] lab results.

## 2022-01-01 ENCOUNTER — Telehealth: Payer: BC Managed Care – PPO | Admitting: Medical

## 2022-01-01 ENCOUNTER — Encounter: Payer: Self-pay | Admitting: Medical

## 2022-01-01 VITALS — BP 123/79 | HR 84 | Wt 167.0 lb

## 2022-01-01 DIAGNOSIS — R051 Acute cough: Secondary | ICD-10-CM

## 2022-01-01 DIAGNOSIS — R0602 Shortness of breath: Secondary | ICD-10-CM | POA: Diagnosis not present

## 2022-01-01 DIAGNOSIS — U071 COVID-19: Secondary | ICD-10-CM | POA: Diagnosis not present

## 2022-01-01 MED ORDER — ALBUTEROL SULFATE HFA 108 (90 BASE) MCG/ACT IN AERS: 2.0000 | INHALATION_SPRAY | Freq: Four times a day (QID) | RESPIRATORY_TRACT | 0 refills | Status: DC | PRN

## 2022-01-01 MED ORDER — EMERGEN-C IMMUNE PLUS PO PACK: 1.0000 | PACK | Freq: Two times a day (BID) | ORAL | 0 refills | Status: DC

## 2022-01-01 NOTE — Progress Notes (Signed)
Subjective:     Patient ID: Andrea Valencia, female   DOB: 21-Jun-1965, 56 y.o.   MRN: [id number]  This visit type was conducted due to national recommendations for restrictions regarding the COVID-19 Pandemic (e.g. social distancing) in an effort to limit this patient's exposure and mitigate transmission in our community.  Due to their co-morbid illnesses, this patient is at least at moderate risk for complications without adequate follow up.  This format is felt to be most appropriate for this patient at this time.    Documentation for virtual audio and video telecommunications through [facility] encounter:  The patient was located at home. The provider was located in the office. The patient did consent to this visit and is aware of possible charges through their insurance for this visit.  The other persons participating in this telemedicine service were none. Time spent on call was 20 minutes and in review of previous records 20 minutes total.  This virtual service is not related to other E/M service within previous 7 days.   HPI Chief Complaint  Patient presents with   Covid Positive    Covid positive- symptoms- runny nose and cough and today having some SOB- tested positive yesterday when symptoms started   Virtual consult for covid.   She notes tested positive for covid yesterday.   Symptoms began yesterday.  She notes runny nose, cough, SOB.  No fever.  Has some chills, fatigue a lot, some sore throat.  No headaches, no body aches, no NVD.  Has chest discomfort.  No hx/o asthma or lung disease, nonsmoker.  No sick contacts.  Using OTC mucinex all in one.  Had covid last year, but took about 2 weeks to resolve.  No other aggravating or relieving factors. No other complaint.   No past medical history on file.  Current Outpatient Medications on File Prior to Visit  Medication Sig Dispense Refill   calcium-vitamin D (OSCAL WITH D) 250-125 MG-UNIT tablet Take 1 tablet by mouth  daily.     No current facility-administered medications on file prior to visit.    Review of Systems As in subjective    Objective:   Physical Exam Due to coronavirus pandemic stay at home measures, patient visit was virtual and they were not examined in person.   BP 123/79   Pulse 84   Wt 167 lb (75.8 kg)   BMI 30.54 kg/m   Gen: wd, wn, nad No witnessed apnea or wheezing      Assessment:     Encounter Diagnoses  Name Primary?   COVID Yes   Acute cough    SOB (shortness of breath)        Plan:      General recommendations: I recommend you rest, hydrate well with water and clear fluids throughout the day.   You can use Tylenol for pain or fever You can use over the counter Mucinex All in One you are using for cough. You can use over the counter Emetrol for nausea.    Medications as below  If you are having trouble breathing, if you are very weak, have high fever 103 or higher consistently despite Tylenol, or uncontrollable nausea and vomiting, then call or go to the emergency department.    If you have other questions or have other symptoms or questions you are concerned about then please make a virtual visit  Covid symptoms such as fatigue and cough can linger over 2 weeks, even after the initial fever, aches, chills,  and other initial symptoms.   Self Quarantine: The CDC, Centers for Disease Control has recommended a self quarantine of 5 days from the start of your illness until you are symptom-free including at least 24 hours of no symptoms including no fever, no shortness of breath, and no body aches and chills, by day 5 before returning to work or general contact with the public.  What does self quarantine mean: avoiding contact with people as much as possible.   Particularly in your house, isolate your self from others in a separate room, wear a mask when possible in the room, particularly if coughing a lot.   Have others bring food, water, medications, etc.,  to your door, but avoid direct contact with your household contacts during this time to avoid spreading the infection to them.   If you have a separate bathroom and living quarters during the next 2 weeks away from others, that would be preferable.    If you can't completely isolate, then wear a mask, wash hands frequently with soap and water for at least 15 seconds, minimize close contact with others, and have a friend or family member check regularly from a distance to make sure you are not getting seriously worse.     You should not be going out in public, should not be going to stores, to work or other public places until all your symptoms have resolved and at least 5 days + 24 hours of no symptoms at all have transpired.   Ideally you should avoid contact with others for a full 5 days if possible.  One of the goals is to limit spread to high risk people; people that are older and elderly, people with multiple health issues like diabetes, heart disease, lung disease, and anybody that has weakened immune systems such as people with cancer or on immunosuppressive therapy.    Andrea Valencia was seen today for covid positive.  Diagnoses and all orders for this visit:  COVID  Acute cough  SOB (shortness of breath)   F/u prn

## 2022-01-13 NOTE — Progress Notes (Signed)
Chief Complaint  Patient presents with   Hypertension    Follow up for hypertension, brought machine today. Had covid 9/30 so needs to wait on booster. Would also like to wait on flu shot. Forgot bp log but husband sent a pic.    Patient presents to follow up on elevated blood pressures. She was seen the end of August with elevated BP's. We discussed low sodium diet, monitoring BP, working on diet, exercise and weight loss, and to f/u with her list of BP's and to bring her monitor to verify the accuracy. She was encouraged to cut out regular soda, cut back on chips, salting her food  She stopped salting her food. Only had 1 bag of [employer] chips since last visit. Cut back on soda, but still having some. She was good with exercising until she got COVID (end of September)--had been walking, elliptical, and doing videos. She was getting 10K steps/day until she got sick.   BP's have been running: Starting from today and working backwards (checking daily) Today-- 142/78 P62  (morning) 138/72 P59 120/72 P 62 (morning) 126/72 P56 (11am) 140/81 P60 (noon), 123/68 P60 that evening 148/76 P69 (5pm)--very congested 132/76 P57 (5pm) 125/73 P69 (morning) 129/73 (noon) 135/75 (6pm) 136/74 (morning) 120/68 (morning) 132/75 (3pm)--tested + COVID  BP Readings from Last 3 Encounters:  01/14/22 130/80  01/01/22 123/79  12/03/21 (!) 140/88   She brought in [facility] labs (done at work). Labs from 03/17/2021: Glu 93, normal c-met, Cr 1.01 TC 181, TG 82, HDL 2, LDL 114, ratio 3.5 TSH 0.950  and other thyroid studies normal Normal CBC  PMH, PSH, SH reviewed  Outpatient Encounter Medications as of 01/14/2022  Medication Sig Note   albuterol (VENTOLIN HFA) 108 (90 Base) MCG/ACT inhaler Inhale 2 puffs into the lungs every 6 (six) hours as needed for wheezing or shortness of breath. 01/14/2022: Used yesterday at noon   calcium-vitamin D (OSCAL WITH D) 250-125 MG-UNIT tablet Take 1 tablet by  mouth daily. 09/23/2020: Pt. Take 125 mg vit d and 600 mg of calcium    ELDERBERRY PO Take 1 capsule by mouth daily.    guaiFENesin-dextromethorphan (ROBITUSSIN DM) 100-10 MG/5ML syrup Take 15 mLs by mouth every 4 (four) hours as needed for cough. 01/14/2022: Took last night   Multiple Vitamins-Minerals (EMERGEN-C IMMUNE PLUS) PACK Take 1 tablet by mouth 2 (two) times daily.    No facility-administered encounter medications on file as of 01/14/2022.   No Known Allergies  ROS: no fever, chills, HA, chest pain, shortness of breath, edema, GI complaints, URI symptoms or other complaints.   PHYSICAL EXAM:  BP 130/80   Pulse 68   Ht 5' 2"  (1.575 m)   Wt 167 lb (75.8 kg)   BMI 30.54 kg/m   Wt Readings from Last 3 Encounters:  01/14/22 167 lb (75.8 kg)  01/01/22 167 lb (75.8 kg)  12/03/21 167 lb 9.6 oz (76 kg)  139/78 with her machine, LA 134/80 by MD, LA  Well-appearing, pleasant female in no distress HEENT: conjunctiva and sclera are clear, EOMI Neck: no lymphadenopathy, thyromegaly or carotid bruit Heart: regular rate and rhythm, no murmur Lungs: clear bilaterally Back: no CVA tenderness Abdomen: soft, nontender, no organomegaly or mass Extremities: no edema, normal pulses Psych: normal mood, affect, hygiene and grooming Neuro: alert and oriented, cranial nerves intact, normal gait   ASSESSMENT/PLAN:   Blood pressure elevated without history of HTN - BP's have improved, home monitor accurate. Cont low Na diet, exercise,  wt loss.    Vaccine counseling - she declined flu shot today--some residual congestion from recent COVID.  Encouraged NV, and also get shingrix.  Can wait 3 mos for COVID boster.  BMI 30.0-30.9,adult - counseled re: diet, exercise.  To try and eliminate regular sodas. Reviewed healthy diet  BP monitor accurate, BP's improved. Flu shot, COVID booster and Shingrix discussed. She checked insurance, and is covered. To schedule NV in 1-2 weeks to get flu and  Shingrix. Can wait 3 mos for COVID. She will check with GYN at her next visit (December) to find out when the last tetanus shot was, and let us know,  Her health clinic at [place] will be closing the end of the year.  Should schedule CPE with new provider in about 6 months, with NP.

## 2022-01-14 ENCOUNTER — Ambulatory Visit: Payer: BC Managed Care – PPO | Admitting: Family Medicine

## 2022-01-14 ENCOUNTER — Encounter: Payer: Self-pay | Admitting: Family Medicine

## 2022-01-14 VITALS — BP 130/80 | HR 68 | Ht 62.0 in | Wt 167.0 lb

## 2022-01-14 DIAGNOSIS — R030 Elevated blood-pressure reading, without diagnosis of hypertension: Secondary | ICD-10-CM

## 2022-01-14 DIAGNOSIS — Z6830 Body mass index (BMI) 30.0-30.9, adult: Secondary | ICD-10-CM | POA: Diagnosis not present

## 2022-01-14 DIAGNOSIS — Z7185 Encounter for immunization safety counseling: Secondary | ICD-10-CM

## 2022-01-14 NOTE — Patient Instructions (Addendum)
I encourage you to get the flu shot within the next few weeks. You can push off any COVID booster for 3 months due to your recent illness.  Return in 1-2 weeks for flu shot and Shingles vaccine. Schedule the 2nd shingles vaccine for 2 months later when you leave from that appointment.  Continue to limit the sodium in your diet. Continue regular exercise. Continue to try and avoid chips and cut back on the soda intake. Continue to monitor your blood pressure periodically to ensure they remain good (goal is <130/80, up to 135/85.  Occasionally higher is fine, just the average should be 130/80.  Check with your GYN in December about your last tetanus shot, and please send [place] the date (confirming that it was TdaP), so that we can enter it into your chart.  I recommend getting a physical in about 6 months with the new Nurse Practitioner.

## 2022-01-28 ENCOUNTER — Other Ambulatory Visit: Payer: Self-pay | Admitting: Medical

## 2022-01-29 ENCOUNTER — Other Ambulatory Visit (INDEPENDENT_AMBULATORY_CARE_PROVIDER_SITE_OTHER): Payer: BC Managed Care – PPO

## 2022-01-29 DIAGNOSIS — Z23 Encounter for immunization: Secondary | ICD-10-CM | POA: Diagnosis not present

## 2022-02-02 ENCOUNTER — Encounter: Payer: Self-pay | Admitting: Family Medicine

## 2022-02-17 ENCOUNTER — Encounter: Payer: Self-pay | Admitting: Internal Medicine

## 2022-03-25 DIAGNOSIS — Z01419 Encounter for gynecological examination (general) (routine) without abnormal findings: Secondary | ICD-10-CM | POA: Diagnosis not present

## 2022-03-25 DIAGNOSIS — Z780 Asymptomatic menopausal state: Secondary | ICD-10-CM | POA: Diagnosis not present

## 2022-03-25 DIAGNOSIS — M8580 Other specified disorders of bone density and structure, unspecified site: Secondary | ICD-10-CM | POA: Diagnosis not present

## 2022-04-01 ENCOUNTER — Other Ambulatory Visit (INDEPENDENT_AMBULATORY_CARE_PROVIDER_SITE_OTHER): Payer: BC Managed Care – PPO

## 2022-04-01 DIAGNOSIS — Z23 Encounter for immunization: Secondary | ICD-10-CM | POA: Diagnosis not present

## 2022-11-09 ENCOUNTER — Other Ambulatory Visit: Payer: Self-pay | Admitting: Obstetrics and Gynecology

## 2022-11-09 DIAGNOSIS — Z1231 Encounter for screening mammogram for malignant neoplasm of breast: Secondary | ICD-10-CM

## 2022-11-22 ENCOUNTER — Ambulatory Visit
Admission: RE | Admit: 2022-11-22 | Discharge: 2022-11-22 | Disposition: A | Discharge status: Home / Self Care | Payer: BC Managed Care – PPO | Source: Ambulatory Visit | Attending: Obstetrics and Gynecology | Admitting: Obstetrics and Gynecology

## 2022-11-22 DIAGNOSIS — Z1231 Encounter for screening mammogram for malignant neoplasm of breast: Secondary | ICD-10-CM

## 2022-12-27 LAB — HM COLONOSCOPY

## 2022-12-28 IMAGING — MG MM DIGITAL SCREENING BILAT W/ TOMO AND CAD
8 series · 8 of 24 positions shown · non-contrast
Comparison: Previous exam(s).

CLINICAL DATA: Screening.

EXAM:
DIGITAL SCREENING BILATERAL MAMMOGRAM WITH TOMOSYNTHESIS AND CAD
TECHNIQUE: Bilateral screening digital craniocaudal and mediolateral oblique
mammograms were obtained. Bilateral screening digital breast
tomosynthesis was performed. The images were evaluated with
computer-aided detection.

[L MLO synth-2D]
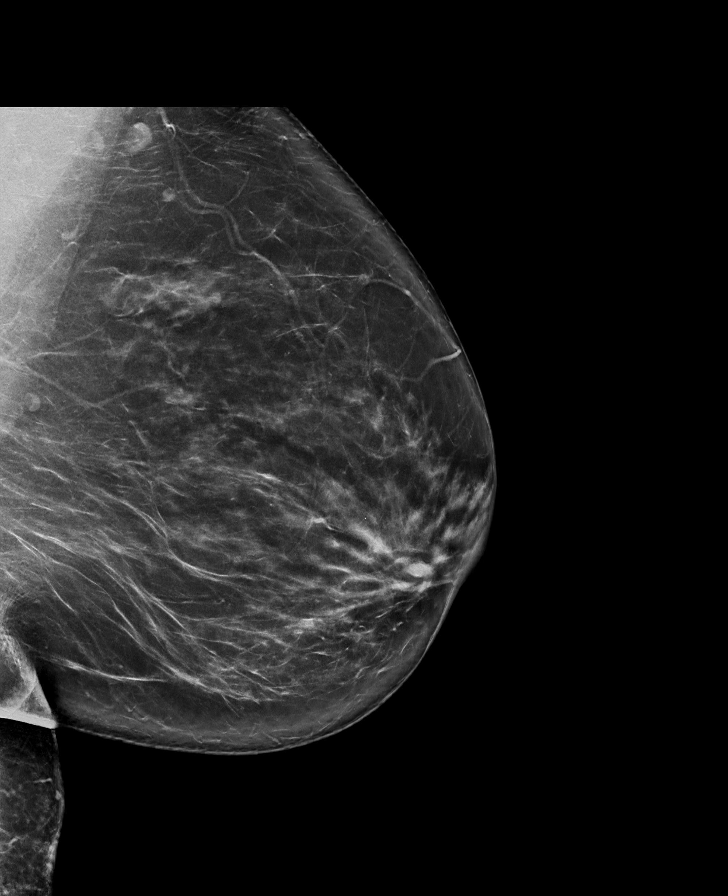

[L CC synth-2D]
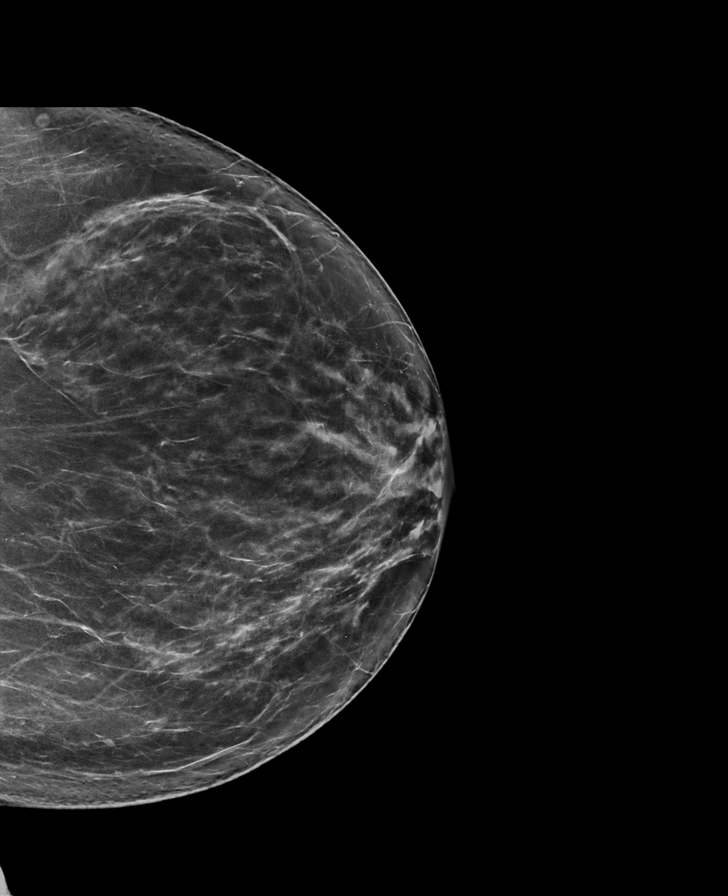

[R MLO synth-2D]
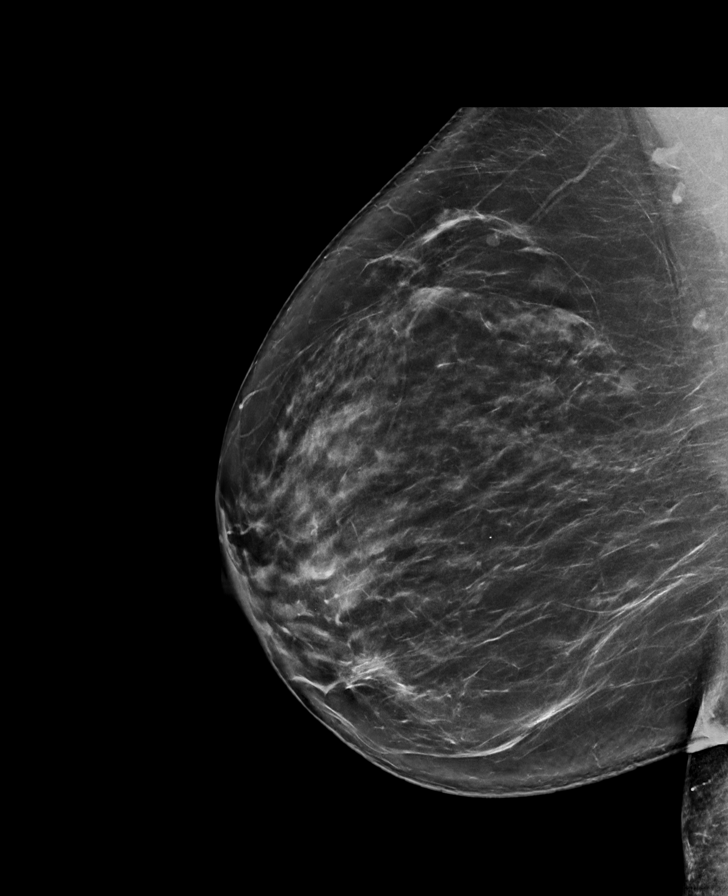

[R CC synth-2D]
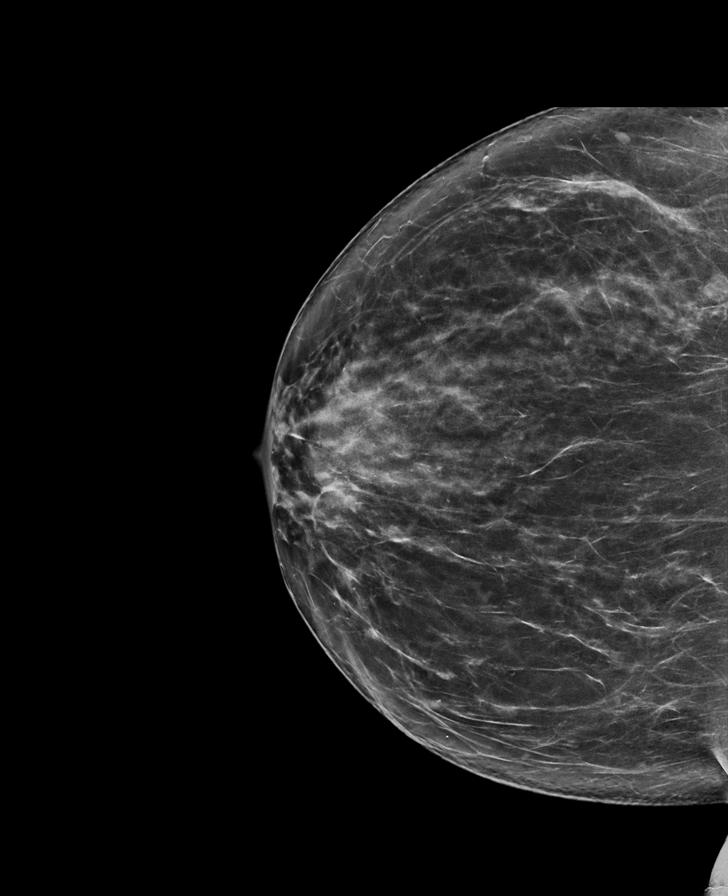

[L CC tomo · tomo slice 40/79.0]
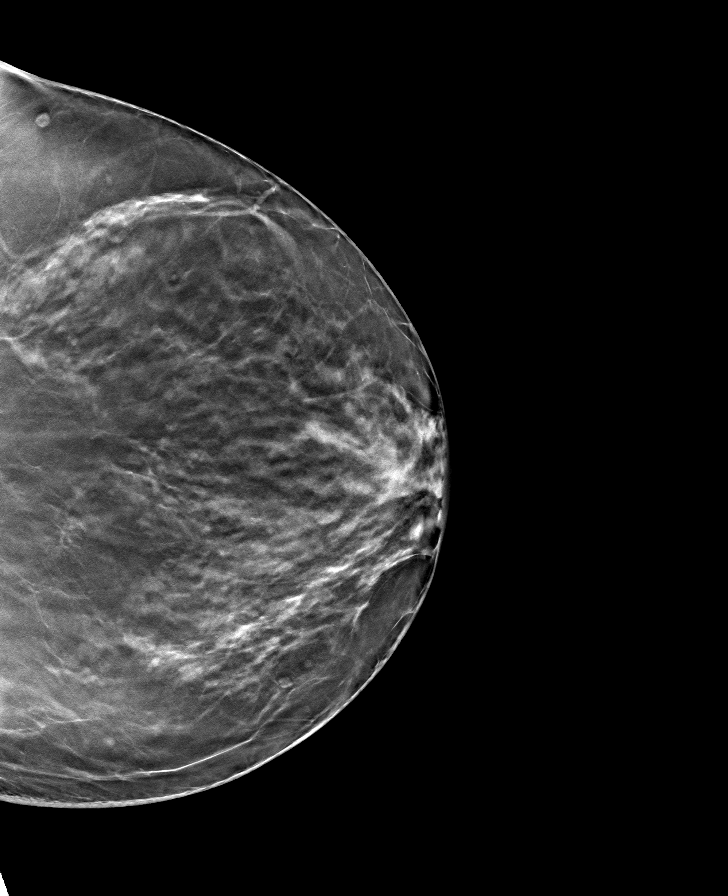

[L MLO tomo · tomo slice 45/90.0]
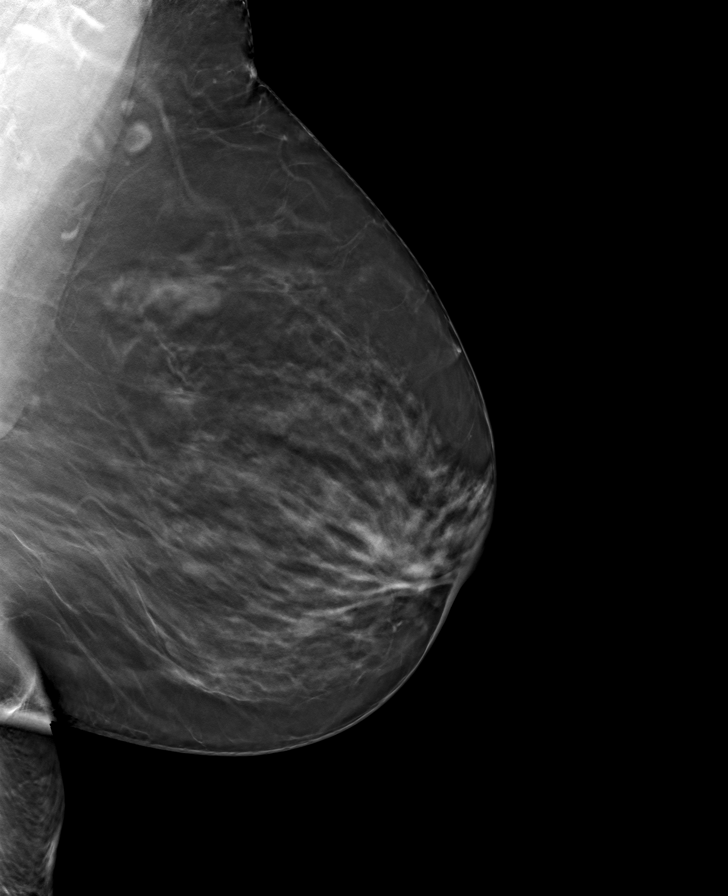

[R CC tomo · tomo slice 40/79.0]
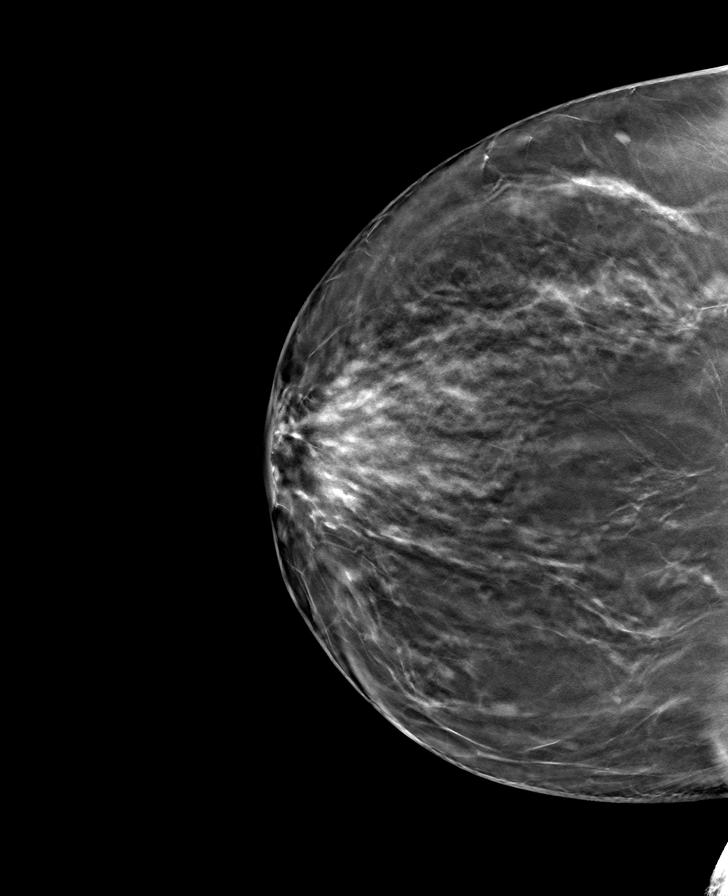

[R MLO tomo · tomo slice 43/86.0]
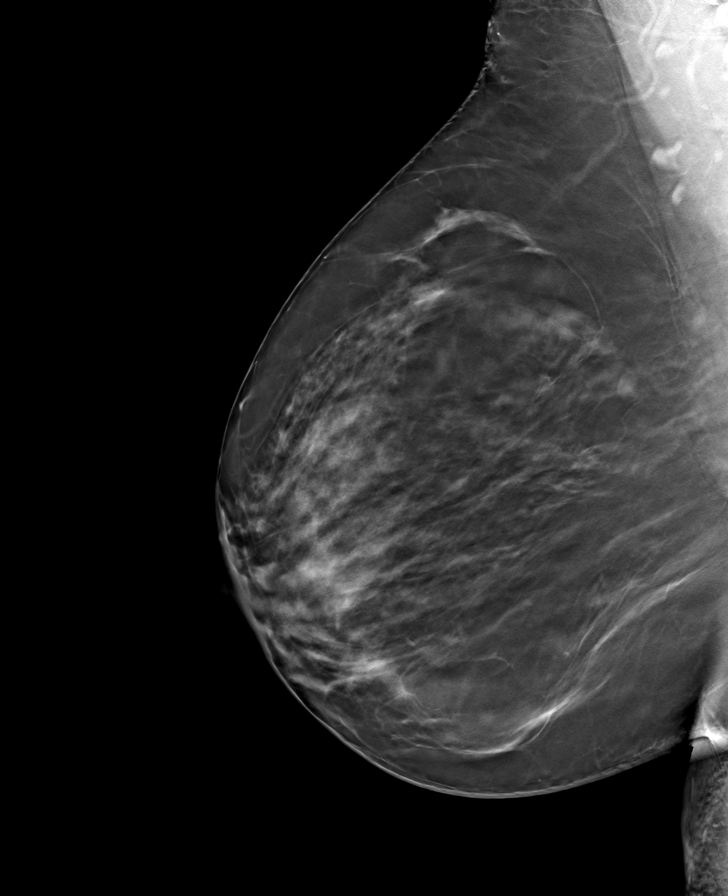

[8 of 24 positions shown; findings below may reference images not displayed]

ACR Breast Density Category c: The breast tissue is heterogeneously
dense, which may obscure small masses.
FINDINGS: There are no findings suspicious for malignancy.
IMPRESSION: No mammographic evidence of malignancy. A result letter of this
screening mammogram will be mailed directly to the patient.

RECOMMENDATION:
Screening mammogram in one year. (Code:Q3-W-BC3)

BI-RADS CATEGORY  1: Negative.

## 2022-12-29 ENCOUNTER — Encounter: Payer: Self-pay | Admitting: Nurse Practitioner

## 2023-01-11 ENCOUNTER — Other Ambulatory Visit: Payer: BC Managed Care – PPO

## 2023-01-11 ENCOUNTER — Ambulatory Visit: Payer: BC Managed Care – PPO | Admitting: Nurse Practitioner

## 2023-01-11 ENCOUNTER — Encounter: Payer: Self-pay | Admitting: Nurse Practitioner

## 2023-01-11 VITALS — BP 130/82 | HR 58 | Ht 62.5 in | Wt 169.2 lb

## 2023-01-11 DIAGNOSIS — Z23 Encounter for immunization: Secondary | ICD-10-CM | POA: Diagnosis not present

## 2023-01-11 DIAGNOSIS — Z6830 Body mass index (BMI) 30.0-30.9, adult: Secondary | ICD-10-CM

## 2023-01-11 DIAGNOSIS — E782 Mixed hyperlipidemia: Secondary | ICD-10-CM

## 2023-01-11 DIAGNOSIS — R079 Chest pain, unspecified: Secondary | ICD-10-CM

## 2023-01-11 DIAGNOSIS — Z0000 Encounter for general adult medical examination without abnormal findings: Secondary | ICD-10-CM

## 2023-01-11 DIAGNOSIS — R030 Elevated blood-pressure reading, without diagnosis of hypertension: Secondary | ICD-10-CM | POA: Diagnosis not present

## 2023-01-11 DIAGNOSIS — R0602 Shortness of breath: Secondary | ICD-10-CM

## 2023-01-11 NOTE — Progress Notes (Signed)
Shawna Clamp, DNP, AGNP-c [facility] [address] [place], [place] [address] Main Office [phone]  BP 130/82   Pulse (!) 58   Ht 5' 2.5" (1.588 m)   Wt 169 lb 3.2 oz (76.7 kg)   BMI 30.45 kg/m    Subjective:    Patient ID: Andrea Valencia, female    DOB: [birth year], 57 y.o.   MRN: [id number]  HPI: Andrea Valencia is a 57 y.o. female presenting on 01/11/2023 for comprehensive medical examination.  Mammo 11/22/2022 12/27/2022 colonoscopy Dr. Cherly Hensen OB GYN  History of Present Illness The patient, with no significant medical history, presented for a routine physical examination after a considerable period without one. She reported occasional chest pain, described as a brief, less than five-minute episode of discomfort in the upper chest area. The pain was not associated with any other symptoms such as sweating, nausea, or radiation to the arm or jaw. The patient also reported occasional acid reflux, particularly after consuming spicy food.  The patient mentioned a history of uterine fibroids, which had been treated with laser therapy over ten years ago. She reported no current discomfort or issues related to the fibroids.  The patient also reported some issues with vision, particularly at night, and believed her current glasses prescription might not be accurate. She wears glasses but not consistently.  The patient reported no regular physical activity beyond walking and expressed a desire to return to a regular exercise routine. She also reported occasional use of an albuterol inhaler, primarily during a previous COVID-19 infection, but has not needed it since.  The patient also reported some fluctuations in blood pressure readings at home, with the highest reading being 142/73 and the lowest around 128/70. She expressed a preference to avoid medication for blood pressure if possible.  The patient reported no changes in bowel or bladder habits, no  vaginal bleeding, and no swelling in the feet or ankles. She did note some occasional tightness in her ring finger, which she attributed to dehydration.  Pertinent items are noted in HPI.  IMMUNIZATIONS:   Flu Vaccine: Flu vaccine given today Prevnar 13: Prevnar 13 N/A for this patient Prevnar 20: Prevnar 20 N/A for this patient Pneumovax 23: Pneumovax 23 N/A for this patient Vac Shingrix: Shingrix both doses completed, documentation in chart HPV: N/A or Aged Out Tetanus: Tetanus completed in the last 10 years COVID: Declined today. Information on where to obtain the vaccine was provided.  RSV: No  HEALTH MAINTENANCE: Pap Smear HM Status: is up to date Mammogram HM Status: is up to date Colon Cancer Screening HM Status: is up to date Bone Density HM Status: N/A STI Testing HM Status: was declined  Lung CT HM Status: N/A  Concerns with vision, hearing, or dentition: No  Most Recent Depression Screen:     01/31/2023    6:24 AM 12/03/2021    3:14 PM  Depression screen PHQ 2/9  Decreased Interest 0 0  Down, Depressed, Hopeless 0 0  PHQ - 2 Score 0 0   Most Recent Anxiety Screen:      No data to display         Most Recent Fall Screen:    12/03/2021    3:14 PM  Fall Risk   Falls in the past year? 0  Number falls in past yr: 0  Injury with Fall? 0  Risk for fall due to : No Fall Risks  Follow up Falls evaluation completed    Past medical  history, surgical history, medications, allergies, family history and social history reviewed with patient today and changes made to appropriate areas of the chart.  Past Medical History:  Past Medical History:  Diagnosis Date   Encounter for annual physical exam 01/31/2023   Moderate mixed hyperlipidemia not requiring statin therapy 01/31/2023   Medications:  Current Outpatient Medications on File Prior to Visit  Medication Sig   albuterol (VENTOLIN HFA) 108 (90 Base) MCG/ACT inhaler TAKE 2 PUFFS BY MOUTH EVERY 6 HOURS AS  NEEDED FOR WHEEZE OR SHORTNESS OF BREATH   calcium-vitamin D (OSCAL WITH D) 250-125 MG-UNIT tablet Take 1 tablet by mouth daily.   ELDERBERRY PO Take 1 capsule by mouth daily.   No current facility-administered medications on file prior to visit.   Surgical History:  Past Surgical History:  Procedure Laterality Date   CESAREAN SECTION  1999   Allergies:  No Known Allergies Family History:  History reviewed. No pertinent family history.     Objective:    BP 130/82   Pulse (!) 58   Ht 5' 2.5" (1.588 m)   Wt 169 lb 3.2 oz (76.7 kg)   BMI 30.45 kg/m   Wt Readings from Last 3 Encounters:  01/11/23 169 lb 3.2 oz (76.7 kg)  01/14/22 167 lb (75.8 kg)  01/01/22 167 lb (75.8 kg)    Physical Exam Vitals and nursing note reviewed.  Constitutional:      General: She is not in acute distress.    Appearance: Normal appearance.  HENT:     Head: Normocephalic and atraumatic.     Right Ear: Hearing, tympanic membrane, ear canal and external ear normal.     Left Ear: Hearing, tympanic membrane, ear canal and external ear normal.     Nose: Nose normal.     Right Sinus: No maxillary sinus tenderness or frontal sinus tenderness.     Left Sinus: No maxillary sinus tenderness or frontal sinus tenderness.     Mouth/Throat:     Lips: Pink.     Mouth: Mucous membranes are moist.     Pharynx: Oropharynx is clear.  Eyes:     General: Lids are normal. Vision grossly intact.     Extraocular Movements: Extraocular movements intact.     Conjunctiva/sclera: Conjunctivae normal.     Pupils: Pupils are equal, round, and reactive to light.     Funduscopic exam:    Right eye: Red reflex present.        Left eye: Red reflex present.    Visual Fields: Right eye visual fields normal and left eye visual fields normal.  Neck:     Thyroid: No thyromegaly.     Vascular: No carotid bruit.  Cardiovascular:     Rate and Rhythm: Normal rate and regular rhythm.     Chest Wall: PMI is not displaced.      Pulses: Normal pulses.          Dorsalis pedis pulses are 2+ on the right side and 2+ on the left side.       Posterior tibial pulses are 2+ on the right side and 2+ on the left side.     Heart sounds: Normal heart sounds. No murmur heard. Pulmonary:     Effort: Pulmonary effort is normal. No respiratory distress.     Breath sounds: Normal breath sounds.  Abdominal:     General: Abdomen is flat. Bowel sounds are normal. There is no distension.     Palpations: Abdomen is soft.  There is no hepatomegaly, splenomegaly or mass.     Tenderness: There is no abdominal tenderness. There is no right CVA tenderness, left CVA tenderness, guarding or rebound.  Musculoskeletal:        General: Normal range of motion.     Cervical back: Full passive range of motion without pain, normal range of motion and neck supple. No tenderness.     Right lower leg: No edema.     Left lower leg: No edema.  Feet:     Left foot:     Toenail Condition: Left toenails are normal.  Lymphadenopathy:     Cervical: No cervical adenopathy.     Upper Body:     Right upper body: No supraclavicular adenopathy.     Left upper body: No supraclavicular adenopathy.  Skin:    General: Skin is warm and dry.     Capillary Refill: Capillary refill takes less than 2 seconds.     Nails: There is no clubbing.  Neurological:     General: No focal deficit present.     Mental Status: She is alert and oriented to person, place, and time.     GCS: GCS eye subscore is 4. GCS verbal subscore is 5. GCS motor subscore is 6.     Sensory: Sensation is intact.     Motor: Motor function is intact.     Coordination: Coordination is intact.     Gait: Gait is intact.     Deep Tendon Reflexes: Reflexes are normal and symmetric.  Psychiatric:        Attention and Perception: Attention normal.        Mood and Affect: Mood normal.        Speech: Speech normal.        Behavior: Behavior normal. Behavior is cooperative.        Thought Content:  Thought content normal.        Cognition and Memory: Cognition and memory normal.        Judgment: Judgment normal.      Results for orders placed or performed in visit on 01/11/23  CBC with Differential/Platelet  Result Value Ref Range   WBC 6.7 3.4 - 10.8 x10E3/uL   RBC 4.63 3.77 - 5.28 x10E6/uL   Hemoglobin 13.3 11.1 - 15.9 g/dL   Hematocrit 40.9 81.1 - 46.6 %   MCV 92 79 - 97 fL   MCH 28.7 26.6 - 33.0 pg   MCHC 31.1 (L) 31.5 - 35.7 g/dL   RDW 91.4 78.2 - 95.6 %   Platelets 303 150 - 450 x10E3/uL   Neutrophils 52 Not Estab. %   Lymphs 39 Not Estab. %   Monocytes 7 Not Estab. %   Eos 1 Not Estab. %   Basos 1 Not Estab. %   Neutrophils Absolute 3.4 1.4 - 7.0 x10E3/uL   Lymphocytes Absolute 2.6 0.7 - 3.1 x10E3/uL   Monocytes Absolute 0.5 0.1 - 0.9 x10E3/uL   EOS (ABSOLUTE) 0.1 0.0 - 0.4 x10E3/uL   Basophils Absolute 0.1 0.0 - 0.2 x10E3/uL   Immature Granulocytes 0 Not Estab. %   Immature Grans (Abs) 0.0 0.0 - 0.1 x10E3/uL  CMP14+EGFR  Result Value Ref Range   Glucose 102 (H) 70 - 99 mg/dL   BUN 9 6 - 24 mg/dL   Creatinine, Ser 2.13 0.57 - 1.00 mg/dL   eGFR 75 >08 MV/HQI/6.96   BUN/Creatinine Ratio 10 9 - 23   Sodium 142 134 - 144 mmol/L  Potassium 4.8 3.5 - 5.2 mmol/L   Chloride 103 96 - 106 mmol/L   CO2 23 20 - 29 mmol/L   Calcium 9.8 8.7 - 10.2 mg/dL   Total Protein 6.8 6.0 - 8.5 g/dL   Albumin 4.2 3.8 - 4.9 g/dL   Globulin, Total 2.6 1.5 - 4.5 g/dL   Bilirubin Total 0.3 0.0 - 1.2 mg/dL   Alkaline Phosphatase 83 44 - 121 IU/L   AST 18 0 - 40 IU/L   ALT 11 0 - 32 IU/L  Hemoglobin A1c  Result Value Ref Range   Hgb A1c MFr Bld 6.1 (H) 4.8 - 5.6 %   Est. average glucose Bld gHb Est-mCnc 128 mg/dL  Lipid panel  Result Value Ref Range   Cholesterol, Total 194 100 - 199 mg/dL   Triglycerides 83 0 - 149 mg/dL   HDL 51 >10 mg/dL   VLDL Cholesterol Cal 15 5 - 40 mg/dL   LDL Chol Calc (NIH) 272 (H) 0 - 99 mg/dL   Chol/HDL Ratio 3.8 0.0 - 4.4 ratio  TSH  Result  Value Ref Range   TSH 1.170 0.450 - 4.500 uIU/mL       Assessment & Plan:   Problem List Items Addressed This Visit     Encounter for annual physical exam - Primary    CPE completed today. Review of HM activities and recommendations discussed and provided on AVS. Anticipatory guidance, diet, and exercise recommendations provided. Medications, allergies, and hx reviewed and updated as necessary. Orders placed as listed below.  Plan: - Labs ordered. Will make changes as necessary based on results.  - I will review these results and send recommendations via MyChart or a telephone call.  - F/U with CPE in 1 year or sooner for acute/chronic health needs as directed.        Relevant Orders   CBC with Differential/Platelet (Completed)   CMP14+EGFR (Completed)   Hemoglobin A1c (Completed)   Lipid panel (Completed)   TSH (Completed)   Blood pressure elevated without history of HTN    Home blood pressure readings fluctuating, with highest reading of 142/73. Patient is not currently on any antihypertensive medications. BP in the office today is slightly above goal of 130/80. Given that she would like to avoid antihypertensives, I recommend monitoring BP at home and record readings so we can review to determine if the average findings are above goal.  -Advise patient to monitor blood pressure regularly and record readings for review. -Consider low dose medication if at home readings are consistently >130/80 for cardiovascular protection.       Relevant Orders   CBC with Differential/Platelet (Completed)   CMP14+EGFR (Completed)   Hemoglobin A1c (Completed)   Lipid panel (Completed)   TSH (Completed)   BMI 30.0-30.9,adult    Chronic. Activity reportedly limited at this time. Recommend increase physical activity to goal of at least 143m per week to see if this is helpful in weight control.  -Slowly increase your daily activity.  -If chest pain begins, please let me know.       Relevant  Orders   CBC with Differential/Platelet (Completed)   CMP14+EGFR (Completed)   Hemoglobin A1c (Completed)   Lipid panel (Completed)   TSH (Completed)   SOB (shortness of breath)    Historical finding with COVID infection. Now resolved. OK to keep albuterol on hand in the event that this returns with activity. -Please let me know if the symptoms of shortness of breath return.  Intermittent chest pain    Brief episodes of chest pain, lasting less than 5 minutes, occurring infrequently. No associated symptoms such as nausea, sweating, or radiation of pain. No palpitations. Possible silent reflux or extra cardiac beat (PVC or PAC) considered. Exam benign today with no concerning findings present.  -Advise patient to monitor symptoms and report if frequency, duration, or characteristics of pain change. -Report the ED immediately for chest pain that does not go away within a few moments or chest pain that is worse/unusual or concerning.       Moderate mixed hyperlipidemia not requiring statin therapy    Previous slightly elevated cholesterol level noted in September 2023. Dietary recommendations with low saturated fat and increased activity to help reduce risks of cardiovascular disease  -Review recent lab results when available to assess current cholesterol levels.      Other Visit Diagnoses     Need for influenza vaccination       Relevant Orders   Flu vaccine trivalent PF, 6mos and older(Flulaval,Afluria,Fluarix,Fluzone) (Completed)         Follow up plan: No follow-ups on file.  NEXT PREVENTATIVE PHYSICAL DUE IN 1 YEAR.  PATIENT COUNSELING PROVIDED FOR ALL ADULT PATIENTS: A well balanced diet low in saturated fats, cholesterol, and moderation in carbohydrates.  This can be as simple as monitoring portion sizes and cutting back on sugary beverages such as soda and juice to start with.    Daily water consumption of at least 64 ounces.  Physical activity at least 180  minutes per week.  If just starting out, start 10 minutes a day and work your way up.   This can be as simple as taking the stairs instead of the elevator and walking 2-3 laps around the office  purposefully every day.   STD protection, partner selection, and regular testing if high risk.  Limited consumption of alcoholic beverages if alcohol is consumed. For men, I recommend no more than 14 alcoholic beverages per week, spread out throughout the week (max 2 per day). Avoid "binge" drinking or consuming large quantities of alcohol in one setting.  Please let me know if you feel you may need help with reduction or quitting alcohol consumption.   Avoidance of nicotine, if used. Please let me know if you feel you may need help with reduction or quitting nicotine use.   Daily mental health attention. This can be in the form of 5 minute daily meditation, prayer, journaling, yoga, reflection, etc.  Purposeful attention to your emotions and mental state can significantly improve your overall wellbeing  and  Health.  Please know that I am here to help you with all of your health care goals and am happy to work with you to find a solution that works best for you.  The greatest advice I have received with any changes in life are to take it one step at a time, that even means if all you can focus on is the next 60 seconds, then do that and celebrate your victories.  With any changes in life, you will have set backs, and that is OK. The important thing to remember is, if you have a set back, it is not a failure, it is an opportunity to try again! Screening Testing Mammogram Every 1 -2 years based on history and risk factors Starting at age 8 Pap Smear Ages 21-39 every 3 years Ages 33-65 every 5 years with HPV testing More frequent testing may be required based on results  and history Colon Cancer Screening Every 1-10 years based on test performed, risk factors, and history Starting at age 70 Bone  Density Screening Every 2-10 years based on history Starting at age 38 for women Recommendations for men differ based on medication usage, history, and risk factors AAA Screening One time ultrasound Men 43-28 years old who have every smoked Lung Cancer Screening Low Dose Lung CT every 12 months Age 41-80 years with a 30 pack-year smoking history who still smoke or who have quit within the last 15 years   Screening Labs Routine  Labs: Complete Blood Count (CBC), Complete Metabolic Panel (CMP), Cholesterol (Lipid Panel) Every 6-12 months based on history and medications May be recommended more frequently based on current conditions or previous results Hemoglobin A1c Lab Every 3-12 months based on history and previous results Starting at age 57 or earlier with diagnosis of diabetes, high cholesterol, BMI >26, and/or risk factors Frequent monitoring for patients with diabetes to ensure blood sugar control Thyroid Panel (TSH) Every 6 months based on history, symptoms, and risk factors May be repeated more often if on medication HIV One time testing for all patients 53 and older May be repeated more frequently for patients with increased risk factors or exposure Hepatitis C One time testing for all patients 60 and older May be repeated more frequently for patients with increased risk factors or exposure Gonorrhea, Chlamydia Every 12 months for all sexually active persons 13-24 years Additional monitoring may be recommended for those who are considered high risk or who have symptoms Every 12 months for any woman on birth control, regardless of sexual activity PSA Men 50-21 years old with risk factors Additional screening may be recommended from age 71-69 based on risk factors, symptoms, and history  Vaccine Recommendations Tetanus Booster All adults every 10 years Flu Vaccine All patients 6 months and older every year COVID Vaccine All patients 12 years and older Initial dosing  with booster May recommend additional booster based on age and health history HPV Vaccine 2 doses all patients age 43-26 Dosing may be considered for patients over 26 Shingles Vaccine (Shingrix) 2 doses all adults 55 years and older Pneumonia (Pneumovax 63) All adults 65 years and older May recommend earlier dosing based on health history One year apart from Prevnar 47 Pneumonia (Prevnar 8) All adults 65 years and older Dosed 1 year after Pneumovax 23 Pneumonia (Prevnar 20) One time alternative to the two dosing of 13 and 23 For all adults with initial dose of 23, 20 is recommended 1 year later For all adults with initial dose of 13, 23 is still recommended as second option 1 year later

## 2023-01-11 NOTE — Patient Instructions (Signed)
It was so nice to meet you.   Keep a check on your blood pressures and let me know if these are running higher than 130/80 on average. If so we may need to consider medication in a low dose.   Let me know if the tightness in the chest happens or seems to get worse.

## 2023-01-12 LAB — CMP14+EGFR
ALT: 11 [IU]/L (ref 0–32)
AST: 18 [IU]/L (ref 0–40)
Albumin: 4.2 g/dL (ref 3.8–4.9)
Alkaline Phosphatase: 83 [IU]/L (ref 44–121)
BUN/Creatinine Ratio: 10 (ref 9–23)
BUN: 9 mg/dL (ref 6–24)
Bilirubin Total: 0.3 mg/dL (ref 0.0–1.2)
CO2: 23 mmol/L (ref 20–29)
Calcium: 9.8 mg/dL (ref 8.7–10.2)
Chloride: 103 mmol/L (ref 96–106)
Creatinine, Ser: 0.89 mg/dL (ref 0.57–1.00)
Globulin, Total: 2.6 g/dL (ref 1.5–4.5)
Glucose: 102 mg/dL — ABNORMAL HIGH (ref 70–99)
Potassium: 4.8 mmol/L (ref 3.5–5.2)
Sodium: 142 mmol/L (ref 134–144)
Total Protein: 6.8 g/dL (ref 6.0–8.5)
eGFR: 75 mL/min/{1.73_m2} (ref >59)

## 2023-01-12 LAB — CBC WITH DIFFERENTIAL/PLATELET
Basophils Absolute: 0.1 10*3/uL (ref 0.0–0.2)
Basos: 1 %
EOS (ABSOLUTE): 0.1 10*3/uL (ref 0.0–0.4)
Eos: 1 %
Hematocrit: 42.8 % (ref 34.0–46.6)
Hemoglobin: 13.3 g/dL (ref 11.1–15.9)
Immature Grans (Abs): 0 10*3/uL (ref 0.0–0.1)
Immature Granulocytes: 0 %
Lymphocytes Absolute: 2.6 10*3/uL (ref 0.7–3.1)
Lymphs: 39 %
MCH: 28.7 pg (ref 26.6–33.0)
MCHC: 31.1 g/dL — ABNORMAL LOW (ref 31.5–35.7)
MCV: 92 fL (ref 79–97)
Monocytes Absolute: 0.5 10*3/uL (ref 0.1–0.9)
Monocytes: 7 %
Neutrophils Absolute: 3.4 10*3/uL (ref 1.4–7.0)
Neutrophils: 52 %
Platelets: 303 10*3/uL (ref 150–450)
RBC: 4.63 x10E6/uL (ref 3.77–5.28)
RDW: 12.9 % (ref 11.7–15.4)
WBC: 6.7 10*3/uL (ref 3.4–10.8)

## 2023-01-12 LAB — HEMOGLOBIN A1C
Est. average glucose Bld gHb Est-mCnc: 128 mg/dL
Hgb A1c MFr Bld: 6.1 % — ABNORMAL HIGH (ref 4.8–5.6)

## 2023-01-12 LAB — LIPID PANEL
Chol/HDL Ratio: 3.8 ratio (ref 0.0–4.4)
Cholesterol, Total: 194 mg/dL (ref 100–199)
HDL: 51 mg/dL
LDL Chol Calc (NIH): 128 mg/dL — ABNORMAL HIGH (ref 0–99)
Triglycerides: 83 mg/dL (ref 0–149)
VLDL Cholesterol Cal: 15 mg/dL (ref 5–40)

## 2023-01-12 LAB — TSH: TSH: 1.17 u[IU]/mL (ref 0.450–4.500)

## 2023-01-31 ENCOUNTER — Encounter: Payer: Self-pay | Admitting: Nurse Practitioner

## 2023-01-31 DIAGNOSIS — Z6830 Body mass index (BMI) 30.0-30.9, adult: Secondary | ICD-10-CM | POA: Insufficient documentation

## 2023-01-31 DIAGNOSIS — R0602 Shortness of breath: Secondary | ICD-10-CM | POA: Insufficient documentation

## 2023-01-31 DIAGNOSIS — Z0000 Encounter for general adult medical examination without abnormal findings: Secondary | ICD-10-CM

## 2023-01-31 DIAGNOSIS — R079 Chest pain, unspecified: Secondary | ICD-10-CM

## 2023-01-31 DIAGNOSIS — R030 Elevated blood-pressure reading, without diagnosis of hypertension: Secondary | ICD-10-CM | POA: Insufficient documentation

## 2023-01-31 DIAGNOSIS — E782 Mixed hyperlipidemia: Secondary | ICD-10-CM

## 2023-01-31 HISTORY — DX: Mixed hyperlipidemia: E78.2

## 2023-01-31 HISTORY — DX: Shortness of breath: R06.02

## 2023-01-31 HISTORY — DX: Chest pain, unspecified: R07.9

## 2023-01-31 HISTORY — DX: Encounter for general adult medical examination without abnormal findings: Z00.00

## 2023-01-31 NOTE — Assessment & Plan Note (Signed)
Historical finding with COVID infection. Now resolved. OK to keep albuterol on hand in the event that this returns with activity. -Please let me know if the symptoms of shortness of breath return.

## 2023-01-31 NOTE — Assessment & Plan Note (Signed)
Chronic. Activity reportedly limited at this time. Recommend increase physical activity to goal of at least 122m per week to see if this is helpful in weight control.  -Slowly increase your daily activity.  -If chest pain begins, please let me know.

## 2023-01-31 NOTE — Assessment & Plan Note (Signed)

## 2023-01-31 NOTE — Assessment & Plan Note (Signed)
Brief episodes of chest pain, lasting less than 5 minutes, occurring infrequently. No associated symptoms such as nausea, sweating, or radiation of pain. No palpitations. Possible silent reflux or extra cardiac beat (PVC or PAC) considered. Exam benign today with no concerning findings present.  -Advise patient to monitor symptoms and report if frequency, duration, or characteristics of pain change. -Report the ED immediately for chest pain that does not go away within a few moments or chest pain that is worse/unusual or concerning.

## 2023-01-31 NOTE — Assessment & Plan Note (Signed)
Home blood pressure readings fluctuating, with highest reading of 142/73. Patient is not currently on any antihypertensive medications. BP in the office today is slightly above goal of 130/80. Given that she would like to avoid antihypertensives, I recommend monitoring BP at home and record readings so we can review to determine if the average findings are above goal.  -Advise patient to monitor blood pressure regularly and record readings for review. -Consider low dose medication if at home readings are consistently >130/80 for cardiovascular protection.

## 2023-01-31 NOTE — Assessment & Plan Note (Signed)
Previous slightly elevated cholesterol level noted in September 2023. Dietary recommendations with low saturated fat and increased activity to help reduce risks of cardiovascular disease  -Review recent lab results when available to assess current cholesterol levels.

## 2023-02-11 ENCOUNTER — Encounter: Payer: Self-pay | Admitting: Nurse Practitioner

## 2023-02-11 ENCOUNTER — Telehealth: Payer: Self-pay

## 2023-02-11 ENCOUNTER — Ambulatory Visit (INDEPENDENT_AMBULATORY_CARE_PROVIDER_SITE_OTHER): Payer: BC Managed Care – PPO | Admitting: Nurse Practitioner

## 2023-02-11 ENCOUNTER — Other Ambulatory Visit: Payer: Self-pay

## 2023-02-11 VITALS — BP 134/82 | HR 60 | Wt 170.8 lb

## 2023-02-11 DIAGNOSIS — R7303 Prediabetes: Secondary | ICD-10-CM | POA: Diagnosis not present

## 2023-02-11 DIAGNOSIS — R030 Elevated blood-pressure reading, without diagnosis of hypertension: Secondary | ICD-10-CM | POA: Diagnosis not present

## 2023-02-11 NOTE — Progress Notes (Signed)
Shawna Clamp, DNP, AGNP-c [facility]  [address] [place], [place] [address] [phone]  ESTABLISHED PATIENT- Chronic Health and/or Follow-Up Visit  Blood pressure 134/82, pulse 60, weight 170 lb 12.8 oz (77.5 kg).    Andrea Valencia is a 57 y.o. year old female presenting today for evaluation and management of chronic conditions.   The patient, with a history of hypertension, presented for a routine follow-up visit to review new diagnosis of prediabetes. Over the past month, the patient has been monitoring her blood pressure at home, with readings ranging from 124/78 to 140/80. The patient noted one instance of a higher reading of 160/80, which decreased to 140/80 after repeated measurements. The patient also reported noticing an increase in her pulse rate, which has been in the mid to high 60s, compared to her usual rate in the 50s.  The patient has been maintaining a regular diet and has been active, including a recent trip to the beach. She has been working regularly and has not reported any significant changes in her lifestyle or stress levels.  The patient's recent lab results indicated a hemoglobin A1c level of 6.1, placing her in the prediabetes range. The patient reported a family history of diabetes, with her mother having been a diabetic. The patient expressed a desire to manage her prediabetes through diet and exercise before considering medication. She acknowledged the need for lifestyle changes, including regular meal times and portion control.   All ROS negative with exception of what is listed above.   PHYSICAL EXAM Physical Exam Vitals and nursing note reviewed.  Constitutional:      Appearance: Normal appearance.  HENT:     Head: Normocephalic.  Eyes:     Pupils: Pupils are equal, round, and reactive to light.  Cardiovascular:     Rate and Rhythm: Normal rate and regular rhythm.     Pulses: Normal pulses.     Heart sounds: Normal  heart sounds.  Pulmonary:     Effort: Pulmonary effort is normal.     Breath sounds: Normal breath sounds.  Musculoskeletal:        General: Normal range of motion.     Cervical back: Normal range of motion.  Skin:    General: Skin is warm.  Neurological:     General: No focal deficit present.     Mental Status: She is alert and oriented to person, place, and time.  Psychiatric:        Mood and Affect: Mood normal.      PLAN Problem List Items Addressed This Visit     Blood pressure elevated without history of HTN - Primary    Intermittent elevated blood pressure readings, highest at 140/80 mmHg. Possible contributing factors include stress and time of day. No consistent hypertension pattern. Discussed as-needed medication if blood pressure consistently rises. - Monitor blood pressure at home - Report if blood pressure remains elevated for several days - Consider as-needed medication if blood pressure consistently rises       Prediabetes    Hemoglobin A1c of 6.1%, indicating prediabetes. Discussed insulin resistance, emphasizing diet and exercise for blood sugar management. Potential future need for medication, with metformin as a common first-line option at 500 mg once daily. Tyreanna Bisesi treatment may delay diabetes progression. - Implement dietary changes focusing on reducing simple carbohydrates and increasing protein intake - Incorporate regular exercise - Provide educational materials on diet and prediabetes - Schedule follow-up lab work in three months to recheck A1c  Return in about 3 months (around 05/14/2023) for Labs-A1c. Time: 36 minutes, >50% spent counseling, care coordination, chart review, and documentation.   Shawna Clamp, DNP, AGNP-c

## 2023-02-11 NOTE — Telephone Encounter (Signed)
Pt scheduled for labs on 05/16/23. Will you put in the orders?

## 2023-02-11 NOTE — Patient Instructions (Signed)
WEIGHT LOSS PLANNING Your progress today shows:     02/11/2023   11:27 AM 01/11/2023    3:21 PM 01/14/2022    3:45 PM  Vitals with BMI  Height  5' 2.5" 5\' 2"   Weight 170 lbs 13 oz 169 lbs 3 oz 167 lbs  BMI  30.43 30.54  Systolic 134 130 161  Diastolic 82 82 80  Pulse 60 58 68    For best management of weight, it is vital to balance intake versus output. This means the number of calories burned per day must be less than the calories you take in with food and drink.   I recommend trying to follow a diet with the following: Calories: 1200-1500 calories per day Carbohydrates: 150-180 grams of carbohydrates per day  Why: Gives your body enough "quick fuel" for cells to maintain normal function without sending them into starvation mode.  Protein: At least 90 grams of protein per day- 30 grams with each meal Why: Protein takes longer and uses more energy than carbohydrates to break down for fuel. The carbohydrates in your meals serves as quick energy sources and proteins help use some of that extra quick energy to break down to produce long term energy. This helps you not feel hungry as quickly and protein breakdown burns calories.  Water: Drink AT LEAST 64 ounces of water per day  Why: Water is essential to healthy metabolism. Water helps to fill the stomach and keep you fuller longer. Water is required for healthy digestion and filtering of waste in the body.  Fat: Limit fats in your diet- when choosing fats, choose foods with lower fats content such as lean meats (chicken, fish, [place]).  Why: Increased fat intake leads to storage "for later". Once you burn your carbohydrate energy, your body goes into fat and protein breakdown mode to help you loose weight.  Cholesterol: Fats and oils that are LIQUID at room temperature are best. Choose vegetable oils (olive oil, avocado oil, nuts). Avoid fats that are SOLID at room temperature (animal fats, processed meats). Healthy fats are often found in  whole grains, beans, nuts, seeds, and berries.  Why: Elevated cholesterol levels lead to build up of cholesterol on the inside of your blood vessels. This will eventually cause the blood vessels to become hard and can lead to high blood pressure and damage to your organs. When the blood flow is reduced, but the pressure is high from cholesterol buildup, parts of the cholesterol can break off and form clots that can go to the brain or heart leading to a stroke or heart attack.  Fiber: Increase amount of SOLUBLE the fiber in your diet. This helps to fill you up, lowers cholesterol, and helps with digestion. Some foods high in soluble fiber are oats, peas, beans, apples, carrots, barley, and citrus fruits.   Why: Fiber fills you up, helps remove excess cholesterol, and aids in healthy digestion which are all very important in weight management.   I recommend the following as a minimum activity routine: Purposeful walk or other physical activity at least 20 minutes every single day. This means purposefully taking a walk, jog, bike, swim, treadmill, elliptical, dance, etc.  This activity should be ABOVE your normal daily activities, such as walking at work. Goal exercise should be at least 150 minutes a week- work your way up to this.   Heart Rate: Your maximum exercise heart rate should be 220 - Your Age in Years. When exercising, get your heart rate  up, but avoid going over the maximum targeted heart rate.  60-70% of your maximum heart rate is where you tend to burn the most fat. To find this number:  220 - Age In Years= Max HR  Max HR x 0.6 (or 0.7) = Fat Burning HR The Fat Burning HR is your goal heart rate while working out to burn the most fat.  NEVER exercise to the point your feel lightheaded, weak, nauseated, dizzy. If you experience ANY of these symptoms- STOP exercise! Allow yourself to cool down and your heart rate to come down. Then restart slower next time.  If at ANY TIME you feel chest  pain or chest pressure during exercise, STOP IMMEDIATELY and seek medical attention.

## 2023-02-18 NOTE — Assessment & Plan Note (Signed)
Intermittent elevated blood pressure readings, highest at 140/80 mmHg. Possible contributing factors include stress and time of day. No consistent hypertension pattern. Discussed as-needed medication if blood pressure consistently rises. - Monitor blood pressure at home - Report if blood pressure remains elevated for several days - Consider as-needed medication if blood pressure consistently rises

## 2023-02-18 NOTE — Assessment & Plan Note (Signed)
Hemoglobin A1c of 6.1%, indicating prediabetes. Discussed insulin resistance, emphasizing diet and exercise for blood sugar management. Potential future need for medication, with metformin as a common first-line option at 500 mg once daily. Alani Sabbagh treatment may delay diabetes progression. - Implement dietary changes focusing on reducing simple carbohydrates and increasing protein intake - Incorporate regular exercise - Provide educational materials on diet and prediabetes - Schedule follow-up lab work in three months to recheck A1c

## 2023-05-16 ENCOUNTER — Other Ambulatory Visit: Payer: BC Managed Care – PPO

## 2023-05-16 DIAGNOSIS — R7303 Prediabetes: Secondary | ICD-10-CM

## 2023-05-16 LAB — CMP14+EGFR
ALT: 11 [IU]/L (ref 0–32)
AST: 16 [IU]/L (ref 0–40)
Albumin: 4.1 g/dL (ref 3.8–4.9)
Alkaline Phosphatase: 79 [IU]/L (ref 44–121)
BUN/Creatinine Ratio: 14 (ref 9–23)
BUN: 13 mg/dL (ref 6–24)
Bilirubin Total: 0.4 mg/dL (ref 0.0–1.2)
CO2: 23 mmol/L (ref 20–29)
Calcium: 9.5 mg/dL (ref 8.7–10.2)
Chloride: 103 mmol/L (ref 96–106)
Creatinine, Ser: 0.93 mg/dL (ref 0.57–1.00)
Globulin, Total: 2.4 g/dL (ref 1.5–4.5)
Glucose: 98 mg/dL (ref 70–99)
Potassium: 4.4 mmol/L (ref 3.5–5.2)
Sodium: 139 mmol/L (ref 134–144)
Total Protein: 6.5 g/dL (ref 6.0–8.5)
eGFR: 71 mL/min/{1.73_m2} (ref >59)

## 2023-05-16 LAB — HEMOGLOBIN A1C
Est. average glucose Bld gHb Est-mCnc: 128 mg/dL
Hgb A1c MFr Bld: 6.1 % — ABNORMAL HIGH (ref 4.8–5.6)

## 2023-05-26 ENCOUNTER — Encounter: Payer: Self-pay | Admitting: Nurse Practitioner

## 2023-09-26 ENCOUNTER — Other Ambulatory Visit: Payer: Self-pay | Admitting: Obstetrics and Gynecology

## 2023-09-26 DIAGNOSIS — M8580 Other specified disorders of bone density and structure, unspecified site: Secondary | ICD-10-CM

## 2023-11-15 ENCOUNTER — Other Ambulatory Visit: Payer: Self-pay | Admitting: Obstetrics and Gynecology

## 2023-11-15 DIAGNOSIS — Z1231 Encounter for screening mammogram for malignant neoplasm of breast: Secondary | ICD-10-CM

## 2023-12-01 ENCOUNTER — Ambulatory Visit
Admission: RE | Admit: 2023-12-01 | Discharge: 2023-12-01 | Disposition: A | Discharge status: Home / Self Care | Source: Ambulatory Visit | Attending: Obstetrics and Gynecology | Admitting: Obstetrics and Gynecology

## 2023-12-01 DIAGNOSIS — Z1231 Encounter for screening mammogram for malignant neoplasm of breast: Secondary | ICD-10-CM

## 2023-12-07 ENCOUNTER — Other Ambulatory Visit: Payer: Self-pay | Admitting: Obstetrics and Gynecology

## 2023-12-07 DIAGNOSIS — R928 Other abnormal and inconclusive findings on diagnostic imaging of breast: Secondary | ICD-10-CM

## 2023-12-09 ENCOUNTER — Ambulatory Visit
Admission: RE | Admit: 2023-12-09 | Discharge: 2023-12-09 | Disposition: A | Discharge status: Home / Self Care | Source: Ambulatory Visit | Attending: Obstetrics and Gynecology | Admitting: Obstetrics and Gynecology

## 2023-12-09 DIAGNOSIS — R928 Other abnormal and inconclusive findings on diagnostic imaging of breast: Secondary | ICD-10-CM

## 2023-12-13 ENCOUNTER — Ambulatory Visit (INDEPENDENT_AMBULATORY_CARE_PROVIDER_SITE_OTHER): Admitting: Nurse Practitioner

## 2023-12-13 ENCOUNTER — Other Ambulatory Visit: Payer: Self-pay | Admitting: Obstetrics and Gynecology

## 2023-12-13 VITALS — BP 124/76 | HR 70 | Temp 97.9°F | Wt 173.6 lb

## 2023-12-13 DIAGNOSIS — J069 Acute upper respiratory infection, unspecified: Secondary | ICD-10-CM | POA: Insufficient documentation

## 2023-12-13 DIAGNOSIS — R059 Cough, unspecified: Secondary | ICD-10-CM | POA: Diagnosis not present

## 2023-12-13 DIAGNOSIS — R921 Mammographic calcification found on diagnostic imaging of breast: Secondary | ICD-10-CM

## 2023-12-13 DIAGNOSIS — B9689 Other specified bacterial agents as the cause of diseases classified elsewhere: Secondary | ICD-10-CM

## 2023-12-13 HISTORY — DX: Other specified bacterial agents as the cause of diseases classified elsewhere: J06.9

## 2023-12-13 LAB — POC COVID19 BINAXNOW: SARS Coronavirus 2 Ag: NEGATIVE

## 2023-12-13 LAB — POCT INFLUENZA A/B
Influenza A, POC: NEGATIVE
Influenza B, POC: NEGATIVE

## 2023-12-13 MED ORDER — HYDROCODONE BIT-HOMATROP MBR 5-1.5 MG/5ML PO SOLN: 5.0000 mL | Freq: Three times a day (TID) | ORAL | 0 refills | Status: DC | PRN

## 2023-12-13 MED ORDER — AZITHROMYCIN 250 MG PO TABS: ORAL_TABLET | ORAL | 0 refills | Status: AC

## 2023-12-13 NOTE — Progress Notes (Signed)
 Andrea Valencia Doing, DNP, AGNP-c [facility] [address] [place], [place] [address] [phone]   ACUTE VISIT on 12/13/2023  Blood pressure 124/76, pulse 70, temperature 97.9 F (36.6 C), weight 173 lb 9.6 oz (78.7 kg), SpO2 98%.  Subjective:  HPI  History of Present Illness Andrea Valencia is a 58 year old female who presents with progressive upper respiratory symptoms.  Her symptoms began on Friday with rhinorrhea and sneezing, which have since progressed to involve her chest. She describes the cough as productive, though she struggles to expectorate mucus completely. The cough disrupts her sleep, waking her at night. No fevers or chills are present. She experienced a sore throat over the weekend, which has since improved. She notes that the drainage is not from a runny nose but rather 'straight down' from the back of the nose into the throat.  She has been taking Mucinex every four hours, starting at 8 AM and again at 12 PM, but by 4 PM, she experiences increased sneezing and coughing. She has also been using Mucinex all-in-one, which she believes contains acetaminophen and other components.  She mentions having allergies and suspects that her symptoms may have started as an allergy-related issue. No ear pain is reported. No tenderness in her cheeks or throat is present. She does not report any wheezing, though she acknowledges a sensation of mucus and fluid in her chest.   ROS negative except for what is listed in HPI. History, Medications, Surgery, SDOH, and Family History reviewed and updated as appropriate.  Objective:  Physical Exam Vitals and nursing note reviewed.  HENT:     Head: Normocephalic.     Ears:     Comments: Clear effusion present on the right    Nose: Congestion and rhinorrhea present.     Mouth/Throat:     Pharynx: Posterior oropharyngeal erythema present.  Eyes:     Pupils: Pupils are equal, round, and reactive to light.   Cardiovascular:     Rate and Rhythm: Normal rate and regular rhythm.     Pulses: Normal pulses.     Heart sounds: Normal heart sounds.  Pulmonary:     Effort: Pulmonary effort is normal.     Breath sounds: Rhonchi present.  Lymphadenopathy:     Cervical: Cervical adenopathy present.  Skin:    General: Skin is warm and dry.     Capillary Refill: Capillary refill takes less than 2 seconds.  Neurological:     Mental Status: She is alert and oriented to person, place, and time.  Psychiatric:        Mood and Affect: Mood normal.         Assessment & Plan:   Problem List Items Addressed This Visit     Bacterial URI - Primary   Acute cough with chest congestion began on Friday, progressing from rhinorrhea and sneezing to chest involvement. Negative COVID-19 and influenza tests. No fever or chills. Non-productive cough causing sleep disturbance. Fluid in ears, especially right ear, with bulging tympanic membrane but no infection. Crackles in lower lungs indicating mucus and fluid. Likely bacterial involvement due to initial improvement followed by worsening symptoms. Possible initial allergic component due to blooming allergens. - Prescribe azithromycin  (Z-Pak) for 5 days: 2 pills on the first day, then 1 pill daily for the next 4 days. - Prescribe Hycodan syrup for cough, especially at night, to aid sleep. Caution about potential drowsiness during the day. - Continue Mucinex during the day to help with mucus  clearance. - Encourage plenty of water intake to thin secretions. - Consider using Mucinex DM or Mucinex with pseudoephedrine for additional symptom relief. Pseudoephedrine can be purchased at the pharmacy counter. - Advise wearing a mask at work to prevent spreading illness and protect against further infection. - Rest as much as possible.      Relevant Medications   HYDROcodone  bit-homatropine (HYCODAN) 5-1.5 MG/5ML syrup   Other Visit Diagnoses       Cough, unspecified type        Relevant Orders   POC COVID-19 (Completed)   Influenza A/B (Completed)       Andrea BRAVO [name], DNP, AGNP-c Time: 22 minutes, >50% spent counseling, care coordination, chart review, and documentation.

## 2023-12-13 NOTE — Patient Instructions (Addendum)
 You can try pseudoephedrine in addition to the Mucinex. This has to be purchased behind the pharmacy counter. I recommend trying 1/2 dose to see how that works for you.   I have sent in azithromycin  (antibiotic) and a cough syrup for bedtime.

## 2024-01-02 NOTE — Assessment & Plan Note (Signed)
 Acute cough with chest congestion began on Friday, progressing from rhinorrhea and sneezing to chest involvement. Negative COVID-19 and influenza tests. No fever or chills. Non-productive cough causing sleep disturbance. Fluid in ears, especially right ear, with bulging tympanic membrane but no infection. Crackles in lower lungs indicating mucus and fluid. Likely bacterial involvement due to initial improvement followed by worsening symptoms. Possible initial allergic component due to blooming allergens. - Prescribe azithromycin  (Z-Pak) for 5 days: 2 pills on the first day, then 1 pill daily for the next 4 days. - Prescribe Hycodan syrup for cough, especially at night, to aid sleep. Caution about potential drowsiness during the day. - Continue Mucinex during the day to help with mucus clearance. - Encourage plenty of water intake to thin secretions. - Consider using Mucinex DM or Mucinex with pseudoephedrine for additional symptom relief. Pseudoephedrine can be purchased at the pharmacy counter. - Advise wearing a mask at work to prevent spreading illness and protect against further infection. - Rest as much as possible.

## 2024-01-12 ENCOUNTER — Ambulatory Visit: Payer: BC Managed Care – PPO | Admitting: Nurse Practitioner

## 2024-01-12 ENCOUNTER — Encounter: Payer: Self-pay | Admitting: Nurse Practitioner

## 2024-01-12 VITALS — BP 132/82 | HR 84 | Ht 62.75 in | Wt 171.4 lb

## 2024-01-12 DIAGNOSIS — Z23 Encounter for immunization: Secondary | ICD-10-CM | POA: Diagnosis not present

## 2024-01-12 DIAGNOSIS — Z0000 Encounter for general adult medical examination without abnormal findings: Secondary | ICD-10-CM | POA: Diagnosis not present

## 2024-01-12 DIAGNOSIS — R030 Elevated blood-pressure reading, without diagnosis of hypertension: Secondary | ICD-10-CM | POA: Diagnosis not present

## 2024-01-12 DIAGNOSIS — R0681 Apnea, not elsewhere classified: Secondary | ICD-10-CM | POA: Diagnosis not present

## 2024-01-12 DIAGNOSIS — R7303 Prediabetes: Secondary | ICD-10-CM

## 2024-01-12 DIAGNOSIS — R400 Somnolence: Secondary | ICD-10-CM

## 2024-01-12 DIAGNOSIS — Z6830 Body mass index (BMI) 30.0-30.9, adult: Secondary | ICD-10-CM

## 2024-01-12 DIAGNOSIS — R519 Headache, unspecified: Secondary | ICD-10-CM

## 2024-01-12 LAB — LIPID PANEL

## 2024-01-12 NOTE — Assessment & Plan Note (Signed)
 Blood pressure slightly elevated today. This is another trigger for considering OSA as a possible diagnosis. She will monitor her BP at home a few days a week for the next few weeks and send me a mychart message with the results.

## 2024-01-12 NOTE — Patient Instructions (Signed)
 For all adult patients, I recommend A well balanced diet low in saturated fats, cholesterol, and moderation in carbohydrates.   This can be as simple as monitoring portion sizes and cutting back on sugary beverages such as soda and juice to start with.    Daily water consumption of at least 64 ounces.  Physical activity at least 180 minutes per week, if just starting out.   This can be as simple as taking the stairs instead of the elevator and walking 2-3 laps around the office  purposefully every day.   STD protection, partner selection, and regular testing if high risk.  Limited consumption of alcoholic beverages if alcohol is consumed.  For women, I recommend no more than 7 alcoholic beverages per week, spread out throughout the week.  Avoid binge drinking or consuming large quantities of alcohol in one setting.   Please let me know if you feel you may need help with reduction or quitting alcohol consumption.   Avoidance of nicotine, if used.  Please let me know if you feel you may need help with reduction or quitting nicotine use.   Daily mental health attention.  This can be in the form of 5 minute daily meditation, prayer, journaling, yoga, reflection, etc.   Purposeful attention to your emotions and mental state can significantly improve your overall wellbeing  and  Health.  Please know that I am here to help you with all of your health care goals and am happy to work with you to find a solution that works best for you.  The greatest advice I have received with any changes in life are to take it one step at a time, that even means if all you can focus on is the next 60 seconds, then do that and celebrate your victories.  With any changes in life, you will have set backs, and that is OK. The important thing to remember is, if you have a set back, it is not a failure, it is an opportunity to try again!  Health Maintenance Recommendations Screening Testing Mammogram Every 1 -2  years based on history and risk factors Starting at age 15 Pap Smear Ages 21-39 every 3 years Ages 9-65 every 5 years with HPV testing More frequent testing may be required based on results and history Colon Cancer Screening Every 1-10 years based on test performed, risk factors, and history Starting at age 56 Bone Density Screening Every 2-10 years based on history Starting at age 55 for women Recommendations for men differ based on medication usage, history, and risk factors AAA Screening One time ultrasound Men 102-69 years old who have every smoked Lung Cancer Screening Low Dose Lung CT every 12 months Age 76-80 years with a 30 pack-year smoking history who still smoke or who have quit within the last 15 years  Screening Labs Routine  Labs: Complete Blood Count (CBC), Complete Metabolic Panel (CMP), Cholesterol (Lipid Panel) Every 6-12 months based on history and medications May be recommended more frequently based on current conditions or previous results Hemoglobin A1c Lab Every 3-12 months based on history and previous results Starting at age 11 or earlier with diagnosis of diabetes, high cholesterol, BMI >26, and/or risk factors Frequent monitoring for patients with diabetes to ensure blood sugar control Thyroid Panel (TSH w/ T3 & T4) Every 6 months based on history, symptoms, and risk factors May be repeated more often if on medication HIV One time testing for all patients 85 and older May be  repeated more frequently for patients with increased risk factors or exposure Hepatitis C One time testing for all patients 18 and older May be repeated more frequently for patients with increased risk factors or exposure Gonorrhea, Chlamydia Every 12 months for all sexually active persons 13-24 years Additional monitoring may be recommended for those who are considered high risk or who have symptoms PSA Men 60-66 years old with risk factors Additional screening may be  recommended from age 30-69 based on risk factors, symptoms, and history  Vaccine Recommendations Tetanus Booster All adults every 10 years Flu Vaccine All patients 6 months and older every year COVID Vaccine All patients 12 years and older Initial dosing with booster May recommend additional booster based on age and health history HPV Vaccine 2 doses all patients age 29-26 Dosing may be considered for patients over 26 Shingles Vaccine (Shingrix) 2 doses all adults 55 years and older Pneumonia (Pneumovax 23) All adults 65 years and older May recommend earlier dosing based on health history Pneumonia (Prevnar 12) All adults 65 years and older Dosed 1 year after Pneumovax 23  Additional Screening, Testing, and Vaccinations may be recommended on an individualized basis based on family history, health history, risk factors, and/or exposure.

## 2024-01-12 NOTE — Progress Notes (Signed)
 Catheline Doing, DNP, AGNP-c [facility] [address] [place], [place] [address] Main Office [phone] VISIT TYPE: CPE on 01/12/2024 Today's Vitals   01/12/24 1424  BP: 132/82  Pulse: 84  Weight: 171 lb 6.4 oz (77.7 kg)  Height: 5' 2.75 (1.594 m)   Body mass index is 30.6 kg/m. BP 132/82   Pulse 84   Ht 5' 2.75 (1.594 m)   Wt 171 lb 6.4 oz (77.7 kg)   BMI 30.60 kg/m   Subjective:    Patient ID: Andrea Valencia, female    DOB: 1966-02-21, 58 y.o.   MRN: [id number]  HPI: Pap 05/18/2023  History of Present Illness Andrea Valencia is a 58 year old female who presents for her annual exam.   She experiences headaches primarily on the right side of her head, occurring upon waking either in the morning or during the night, approximately once a week. The pain is described as throbbing and rated as a 5 out of 10 in intensity. There is no effect on her vision, and she reports having had a recent advanced eye exam last month. She does not associate the headaches with stress or specific triggers.  She wakes up at least once a night to use the restroom but does not believe the headaches cause her waking. She is able to fall back asleep without difficulty. She also reports snoring, confirmed by her husband, though she is unaware of it herself.  She reports occasional chest pain described as a cramp-like tightness on the right lasting a few minutes, without radiation to the neck or jaw. No shortness of breath, dizziness, or significant swelling in her feet or ankles, although she notes mild swelling if on her feet all day. No numbness, tingling, or joint pain.  She mentions a past due bone density test ordered by another doctor, which she has not yet completed due to a recent job change. She has not been using her inhaler and reports no changes in her bowel or bladder habits.  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 Her current job at the bank is more stressful than her previous position, but she feels she can manage it better now. She is focused on saving for the future as she is 58 years old.  Pertinent items are noted in HPI.  Most Recent Depression Screen:     01/12/2024    2:24 PM 02/11/2023   11:27 AM 01/31/2023    6:24 AM 12/03/2021    3:14 PM  Depression screen PHQ 2/9  Decreased Interest 0 0 0 0  Down, Depressed, Hopeless 0 0 0 0  PHQ - 2 Score 0 0 0 0   Most Recent Anxiety Screen:      No data to display         Most Recent Fall Screen:    01/12/2024    2:24 PM 02/11/2023   11:27 AM 12/03/2021    3:14 PM  Fall Risk   Falls in the past year? 0 0 0  Number falls in past yr: 0 0 0  Injury with Fall? 0 0 0  Risk for fall due to : No Fall Risks No Fall Risks No Fall Risks  Follow up Falls evaluation completed Falls evaluation completed Falls evaluation completed      Data  saved with a previous flowsheet row definition    Past medical history, surgical history, medications, allergies, family history and social history reviewed with patient today and changes made to appropriate areas of the chart.  Past Medical History:  Past Medical History:  Diagnosis Date   Bacterial URI 12/13/2023   Encounter for annual physical exam 01/31/2023   Intermittent chest pain 01/31/2023   Moderate mixed hyperlipidemia not requiring statin therapy 01/31/2023   SOB (shortness of breath) 01/31/2023   Witnessed apneic spells 01/12/2024   Medications:  Current Outpatient Medications on File Prior to Visit  Medication Sig   albuterol  (VENTOLIN  HFA) 108 (90 Base) MCG/ACT inhaler TAKE 2 PUFFS BY MOUTH EVERY 6 HOURS AS NEEDED FOR WHEEZE OR SHORTNESS OF BREATH   calcium-vitamin D (OSCAL WITH D) 250-125 MG-UNIT tablet Take 1 tablet by mouth daily.   ELDERBERRY PO  Take 1 capsule by mouth daily.   No current facility-administered medications on file prior to visit.   Surgical History:  Past Surgical History:  Procedure Laterality Date   CESAREAN SECTION  1999   Allergies:  No Known Allergies Family History:  No family history on file.     Objective:    BP 132/82   Pulse 84   Ht 5' 2.75 (1.594 m)   Wt 171 lb 6.4 oz (77.7 kg)   BMI 30.60 kg/m   Wt Readings from Last 3 Encounters:  01/12/24 171 lb 6.4 oz (77.7 kg)  12/13/23 173 lb 9.6 oz (78.7 kg)  02/11/23 170 lb 12.8 oz (77.5 kg)    Physical Exam Vitals and nursing note reviewed.  Constitutional:      General: She is not in acute distress.    Appearance: Normal appearance.  HENT:     Head: Normocephalic and atraumatic.     Right Ear: Hearing, tympanic membrane, ear canal and external ear normal.     Left Ear: Hearing, tympanic membrane, ear canal and external ear normal.     Nose: Nose normal.     Right Sinus: No maxillary sinus tenderness or frontal sinus tenderness.     Left Sinus: No maxillary sinus tenderness or frontal sinus tenderness.     Mouth/Throat:     Lips: Pink.     Mouth: Mucous membranes are moist.     Pharynx: Oropharynx is clear.  Eyes:     General: Lids are normal. Vision grossly intact.     Extraocular Movements: Extraocular movements intact.     Conjunctiva/sclera: Conjunctivae normal.     Pupils: Pupils are equal, round, and reactive to light.     Funduscopic exam:    Right eye: Red reflex present.        Left eye: Red reflex present.    Visual Fields: Right eye visual fields normal and left eye visual fields normal.  Neck:     Thyroid : No thyromegaly.     Vascular: No carotid bruit.  Cardiovascular:     Rate and Rhythm: Normal rate and regular rhythm.     Chest Wall: PMI is not displaced.     Pulses: Normal pulses.          Dorsalis pedis pulses are 2+ on the right side and 2+ on the left side.       Posterior tibial pulses are 2+ on the right  side and 2+ on the left side.     Heart sounds: Normal heart sounds. No murmur heard. Pulmonary:     Effort: Pulmonary effort is normal.  No respiratory distress.     Breath sounds: Normal breath sounds.  Abdominal:     General: Abdomen is flat. Bowel sounds are normal. There is no distension.     Palpations: Abdomen is soft. There is no hepatomegaly, splenomegaly or mass.     Tenderness: There is no abdominal tenderness. There is no right CVA tenderness, left CVA tenderness, guarding or rebound.  Musculoskeletal:        General: Normal range of motion.     Cervical back: Full passive range of motion without pain, normal range of motion and neck supple. No tenderness.     Right lower leg: No edema.     Left lower leg: No edema.  Feet:     Left foot:     Toenail Condition: Left toenails are normal.  Lymphadenopathy:     Cervical: No cervical adenopathy.     Upper Body:     Right upper body: No supraclavicular adenopathy.     Left upper body: No supraclavicular adenopathy.  Skin:    General: Skin is warm and dry.     Capillary Refill: Capillary refill takes less than 2 seconds.     Nails: There is no clubbing.  Neurological:     General: No focal deficit present.     Mental Status: She is alert and oriented to person, place, and time.     GCS: GCS eye subscore is 4. GCS verbal subscore is 5. GCS motor subscore is 6.     Sensory: Sensation is intact. No sensory deficit.     Motor: Motor function is intact. No weakness.     Coordination: Coordination is intact. Coordination normal.     Gait: Gait is intact.     Deep Tendon Reflexes: Reflexes are normal and symmetric.  Psychiatric:        Attention and Perception: Attention normal.        Mood and Affect: Mood normal.        Speech: Speech normal.        Behavior: Behavior normal. Behavior is cooperative.        Thought Content: Thought content normal.        Cognition and Memory: Cognition and memory normal.        Judgment:  Judgment normal.    Results for orders placed or performed in visit on 12/13/23  POC COVID-19   Collection Time: 12/13/23 10:40 AM  Result Value Ref Range   SARS Coronavirus 2 Ag Negative Negative  Influenza A/B   Collection Time: 12/13/23 10:42 AM  Result Value Ref Range   Influenza A, POC Negative Negative   Influenza B, POC Negative Negative       Assessment & Plan:   Problem List Items Addressed This Visit     Encounter for annual physical exam - Primary   CPE completed today. Review of HM activities and recommendations discussed and provided on AVS. Anticipatory guidance, diet, and exercise recommendations provided. Medications, allergies, and hx reviewed and updated as necessary. Orders placed as listed below.  Plan: - Labs ordered. Will make changes as necessary based on results.  - I will review these results and send recommendations via MyChart or a telephone call.  - F/U with CPE in 1 year or sooner for acute/chronic health needs as directed.        Blood pressure elevated without history of HTN   Blood pressure slightly elevated today. This is another trigger for considering OSA as a possible diagnosis.  She will monitor her BP at home a few days a week for the next few weeks and send me a mychart message with the results.       Relevant Orders   CBC with Differential/Platelet   CMP14+EGFR   Hemoglobin A1c   Lipid panel   Home sleep test   Daytime sleepiness   Relevant Orders   CBC with Differential/Platelet   CMP14+EGFR   Hemoglobin A1c   Lipid panel   Home sleep test   Witnessed apneic spells   Suspected obstructive sleep apnea due to symptoms of snoring, non-restorative sleep, and recurrent right-sided headaches. Reports snoring as per her husband, and experiences headaches upon waking, which are throbbing in nature and rated as 5/10 in severity. The headaches occur weekly and are not associated with vision changes. Suspected anatomical obstruction during  sleep, leading to decreased oxygenation and non-restorative sleep. Explained the risks of untreated sleep apnea, including increased blood pressure and non-restorative sleep. - Order home sleep study to assess for sleep apnea. - Provide information on sleep apnea and treatment options, including CPAP, oral appliances, weight reduction medications, and the Inspire device for severe cases. - Instruct her to monitor blood pressure at home and report readings.      Relevant Orders   CBC with Differential/Platelet   CMP14+EGFR   Hemoglobin A1c   Lipid panel   Home sleep test   Headache upon awakening   Recurrent right-sided headaches occurring weekly, primarily upon waking. Described as throbbing with a severity of 5/10. No associated vision changes. These headaches may be related to sleep apnea due to discussion of other symptoms present. Will monitor for sleep apnea.       Prediabetes   Relevant Orders   CBC with Differential/Platelet   CMP14+EGFR   Hemoglobin A1c   Lipid panel   BMI 30.0-30.9,adult   Relevant Orders   CBC with Differential/Platelet   CMP14+EGFR   Hemoglobin A1c   Lipid panel   Other Visit Diagnoses       Need for influenza vaccination       Relevant Orders   Flu vaccine trivalent PF, 6mos and older(Flulaval,Afluria,Fluarix,Fluzone) (Completed)       Follow up plan: Return in about 1 year (around 01/11/2025) for CPE.  NEXT PREVENTATIVE PHYSICAL DUE IN 1 YEAR.  PATIENT COUNSELING PROVIDED FOR ALL ADULT PATIENTS: A well balanced diet low in saturated fats, cholesterol, and moderation in carbohydrates.  This can be as simple as monitoring portion sizes and cutting back on sugary beverages such as soda and juice to start with.    Daily water consumption of at least 64 ounces.  Physical activity at least 180 minutes per week.  If just starting out, start 10 minutes a day and work your way up.   This can be as simple as taking the stairs instead of the  elevator and walking 2-3 laps around the office  purposefully every day.   STD protection, partner selection, and regular testing if high risk.  Limited consumption of alcoholic beverages if alcohol is consumed. For men, I recommend no more than 14 alcoholic beverages per week, spread out throughout the week (max 2 per day). Avoid binge drinking or consuming large quantities of alcohol in one setting.  Please let me know if you feel you may need help with reduction or quitting alcohol consumption.   Avoidance of nicotine, if used. Please let me know if you feel you may need help with reduction or quitting nicotine use.  Daily mental health attention. This can be in the form of 5 minute daily meditation, prayer, journaling, yoga, reflection, etc.  Purposeful attention to your emotions and mental state can significantly improve your overall wellbeing  and  Health.  Please know that I am here to help you with all of your health care goals and am happy to work with you to find a solution that works best for you.  The greatest advice I have received with any changes in life are to take it one step at a time, that even means if all you can focus on is the next 60 seconds, then do that and celebrate your victories.  With any changes in life, you will have set backs, and that is OK. The important thing to remember is, if you have a set back, it is not a failure, it is an opportunity to try again! Screening Testing Mammogram Every 1 -2 years based on history and risk factors Starting at age 57 Pap Smear Ages 21-39 every 3 years Ages 90-65 every 5 years with HPV testing More frequent testing may be required based on results and history Colon Cancer Screening Every 1-10 years based on test performed, risk factors, and history Starting at age 43 Bone Density Screening Every 2-10 years based on history Starting at age 40 for women Recommendations for men differ based on medication usage,  history, and risk factors AAA Screening One time ultrasound Men 78-13 years old who have every smoked Lung Cancer Screening Low Dose Lung CT every 12 months Age 18-80 years with a 30 pack-year smoking history who still smoke or who have quit within the last 15 years   Screening Labs Routine  Labs: Complete Blood Count (CBC), Complete Metabolic Panel (CMP), Cholesterol (Lipid Panel) Every 6-12 months based on history and medications May be recommended more frequently based on current conditions or previous results Hemoglobin A1c Lab Every 3-12 months based on history and previous results Starting at age 21 or earlier with diagnosis of diabetes, high cholesterol, BMI >26, and/or risk factors Frequent monitoring for patients with diabetes to ensure blood sugar control Thyroid  Panel (TSH) Every 6 months based on history, symptoms, and risk factors May be repeated more often if on medication HIV One time testing for all patients 75 and older May be repeated more frequently for patients with increased risk factors or exposure Hepatitis C One time testing for all patients 61 and older May be repeated more frequently for patients with increased risk factors or exposure Gonorrhea, Chlamydia Every 12 months for all sexually active persons 13-24 years Additional monitoring may be recommended for those who are considered high risk or who have symptoms Every 12 months for any woman on birth control, regardless of sexual activity PSA Men 44-61 years old with risk factors Additional screening may be recommended from age 50-69 based on risk factors, symptoms, and history  Vaccine Recommendations Tetanus Booster All adults every 10 years Flu Vaccine All patients 6 months and older every year COVID Vaccine All patients 12 years and older Initial dosing with booster May recommend additional booster based on age and health history HPV Vaccine 2 doses all patients age 28-26 Dosing may be  considered for patients over 26 Shingles Vaccine (Shingrix ) 2 doses all adults 55 years and older Pneumonia (Pneumovax 23) All adults 65 years and older May recommend earlier dosing based on health history One year apart from Prevnar 13 Pneumonia (Prevnar 88) All adults 65 years and older Dosed 1  year after Pneumovax 23 Pneumonia (Prevnar 20) One time alternative to the two dosing of 13 and 23 For all adults with initial dose of 23, 20 is recommended 1 year later For all adults with initial dose of 13, 23 is still recommended as second option 1 year later

## 2024-01-12 NOTE — Assessment & Plan Note (Signed)
 Recurrent right-sided headaches occurring weekly, primarily upon waking. Described as throbbing with a severity of 5/10. No associated vision changes. These headaches may be related to sleep apnea due to discussion of other symptoms present. Will monitor for sleep apnea.

## 2024-01-12 NOTE — Assessment & Plan Note (Signed)

## 2024-01-12 NOTE — Assessment & Plan Note (Signed)
 Suspected obstructive sleep apnea due to symptoms of snoring, non-restorative sleep, and recurrent right-sided headaches. Reports snoring as per her husband, and experiences headaches upon waking, which are throbbing in nature and rated as 5/10 in severity. The headaches occur weekly and are not associated with vision changes. Suspected anatomical obstruction during sleep, leading to decreased oxygenation and non-restorative sleep. Explained the risks of untreated sleep apnea, including increased blood pressure and non-restorative sleep. - Order home sleep study to assess for sleep apnea. - Provide information on sleep apnea and treatment options, including CPAP, oral appliances, weight reduction medications, and the Inspire device for severe cases. - Instruct her to monitor blood pressure at home and report readings.

## 2024-01-13 LAB — CMP14+EGFR
ALT: 12 IU/L (ref 0–32)
AST: 17 IU/L (ref 0–40)
Albumin: 4.6 g/dL (ref 3.8–4.9)
Alkaline Phosphatase: 99 IU/L (ref 49–135)
BUN/Creatinine Ratio: 13 (ref 12–28)
BUN: 12 mg/dL (ref 8–27)
Bilirubin Total: 0.3 mg/dL (ref 0.0–1.2)
CO2: 24 mmol/L (ref 20–29)
Calcium: 9.8 mg/dL (ref 8.7–10.3)
Chloride: 101 mmol/L (ref 96–106)
Creatinine, Ser: 0.91 mg/dL (ref 0.57–1.00)
Globulin, Total: 2.8 g/dL (ref 1.5–4.5)
Glucose: 90 mg/dL (ref 70–99)
Potassium: 3.9 mmol/L (ref 3.5–5.2)
Sodium: 139 mmol/L (ref 134–144)
Total Protein: 7.4 g/dL (ref 6.0–8.5)
eGFR: 72 mL/min/1.73 (ref >59)

## 2024-01-13 LAB — LIPID PANEL
Cholesterol, Total: 186 mg/dL (ref 100–199)
HDL: 51 mg/dL (ref >39)
LDL CALC COMMENT:: 3.6 ratio (ref 0.0–4.4)
LDL Chol Calc (NIH): 109 mg/dL — AB (ref 0–99)
Triglycerides: 149 mg/dL (ref 0–149)
VLDL Cholesterol Cal: 26 mg/dL (ref 5–40)

## 2024-01-13 LAB — CBC WITH DIFFERENTIAL/PLATELET
Basophils Absolute: 0.1 x10E3/uL (ref 0.0–0.2)
Basos: 1 %
EOS (ABSOLUTE): 0.1 x10E3/uL (ref 0.0–0.4)
Eos: 1 %
Hematocrit: 44.1 % (ref 34.0–46.6)
Hemoglobin: 14.3 g/dL (ref 11.1–15.9)
Immature Grans (Abs): 0 x10E3/uL (ref 0.0–0.1)
Immature Granulocytes: 0 %
Lymphocytes Absolute: 3.2 x10E3/uL — ABNORMAL HIGH (ref 0.7–3.1)
Lymphs: 37 %
MCH: 29.8 pg (ref 26.6–33.0)
MCHC: 32.4 g/dL (ref 31.5–35.7)
MCV: 92 fL (ref 79–97)
Monocytes Absolute: 0.6 x10E3/uL (ref 0.1–0.9)
Monocytes: 7 %
Neutrophils Absolute: 4.8 x10E3/uL (ref 1.4–7.0)
Neutrophils: 54 %
Platelets: 335 x10E3/uL (ref 150–450)
RBC: 4.8 x10E6/uL (ref 3.77–5.28)
RDW: 12.9 % (ref 11.7–15.4)
WBC: 8.8 x10E3/uL (ref 3.4–10.8)

## 2024-01-13 LAB — HEMOGLOBIN A1C
Est. average glucose Bld gHb Est-mCnc: 123 mg/dL
Hgb A1c MFr Bld: 5.9 % — AB (ref 4.8–5.6)

## 2024-01-19 ENCOUNTER — Ambulatory Visit: Payer: Self-pay | Admitting: Nurse Practitioner

## 2024-06-11 ENCOUNTER — Encounter

## 2025-01-14 ENCOUNTER — Encounter: Payer: Self-pay | Admitting: Nurse Practitioner
